# Patient Record
Sex: Male | Born: 1970 | Race: White | Hispanic: No | Marital: Married | Smoking: Current every day smoker
Health system: Southern US, Community
[De-identification: ages and names within clinical notes are randomized; demographics above are authoritative.]

## PROBLEM LIST (undated history)

## (undated) DIAGNOSIS — E785 Hyperlipidemia, unspecified: Secondary | ICD-10-CM

## (undated) DIAGNOSIS — I10 Essential (primary) hypertension: Secondary | ICD-10-CM

## (undated) DIAGNOSIS — G4730 Sleep apnea, unspecified: Secondary | ICD-10-CM

## (undated) HISTORY — DX: Hyperlipidemia, unspecified: E78.5

## (undated) HISTORY — DX: Sleep apnea, unspecified: G47.30

## (undated) HISTORY — PX: LAPAROSCOPIC GASTRIC BANDING: SHX1100

## (undated) HISTORY — DX: Essential (primary) hypertension: I10

---

## 2010-12-07 ENCOUNTER — Encounter: Payer: Self-pay | Admitting: Nurse Practitioner

## 2010-12-07 DIAGNOSIS — E785 Hyperlipidemia, unspecified: Secondary | ICD-10-CM

## 2010-12-07 DIAGNOSIS — I10 Essential (primary) hypertension: Secondary | ICD-10-CM

## 2010-12-08 ENCOUNTER — Encounter: Payer: Self-pay | Admitting: Nurse Practitioner

## 2013-04-07 ENCOUNTER — Encounter: Payer: Self-pay | Admitting: Nurse Practitioner

## 2013-04-07 ENCOUNTER — Ambulatory Visit (INDEPENDENT_AMBULATORY_CARE_PROVIDER_SITE_OTHER): Payer: BC Managed Care – PPO | Admitting: Nurse Practitioner

## 2013-04-07 VITALS — BP 129/88 | HR 70 | Temp 99.0°F | Ht 75.0 in | Wt 288.0 lb

## 2013-04-07 DIAGNOSIS — F411 Generalized anxiety disorder: Secondary | ICD-10-CM

## 2013-04-07 DIAGNOSIS — I10 Essential (primary) hypertension: Secondary | ICD-10-CM

## 2013-04-07 DIAGNOSIS — E785 Hyperlipidemia, unspecified: Secondary | ICD-10-CM

## 2013-04-07 MED ORDER — HYDROCHLOROTHIAZIDE 25 MG PO TABS: 25.0000 mg | ORAL_TABLET | Freq: Every day | ORAL | Status: DC

## 2013-04-07 MED ORDER — CITALOPRAM HYDROBROMIDE 20 MG PO TABS: 20.0000 mg | ORAL_TABLET | Freq: Every day | ORAL | Status: DC

## 2013-04-07 MED ORDER — ATORVASTATIN CALCIUM 40 MG PO TABS: 40.0000 mg | ORAL_TABLET | Freq: Every day | ORAL | Status: DC

## 2013-04-07 NOTE — Progress Notes (Signed)
  Subjective:    Patient ID: Peter Malling., male    DOB: [birth year], 42 y.o.   MRN: [id number]  Hypertension This is a chronic problem. The current episode started more than 1 year ago. The problem is unchanged. The problem is controlled. Pertinent negatives include no blurred vision, chest pain, headaches, neck pain, orthopnea, palpitations, peripheral edema or shortness of breath. Risk factors for coronary artery disease include dyslipidemia, male gender and family history. Past treatments include diuretics. The current treatment provides mild improvement.  Hyperlipidemia This is a chronic problem. The current episode started more than 1 year ago. The problem is controlled. Exacerbating diseases include obesity. He has no history of diabetes or hypothyroidism. Pertinent negatives include no chest pain or shortness of breath. Current antihyperlipidemic treatment includes statins. The current treatment provides mild improvement of lipids. There are no compliance problems.  Risk factors for coronary artery disease include hypertension and male sex.      Review of Systems  HENT: Negative for neck pain.   Eyes: Negative for blurred vision.  Respiratory: Negative for shortness of breath.   Cardiovascular: Negative for chest pain, palpitations and orthopnea.  Neurological: Negative for headaches.  All other systems reviewed and are negative.       Objective:   Physical Exam  Constitutional: He is oriented to person, place, and time. He appears well-developed and well-nourished.  HENT:  Head: Normocephalic.  Right Ear: External ear normal.  Left Ear: External ear normal.  Nose: Nose normal.  Mouth/Throat: Oropharynx is clear and moist.  Eyes: EOM are normal. Pupils are equal, round, and reactive to light.  Neck: Normal range of motion. Neck supple. No thyromegaly present.  Cardiovascular: Normal rate, regular rhythm, normal heart sounds and intact distal pulses.   No murmur  heard. Pulmonary/Chest: Effort normal and breath sounds normal. He has no wheezes. He has no rales.  Abdominal: Soft. Bowel sounds are normal.  Genitourinary: Prostate normal and penis normal.  Musculoskeletal: Normal range of motion.  Neurological: He is alert and oriented to person, place, and time.  Skin: Skin is warm and dry.  Psychiatric: He has a normal mood and affect. His behavior is normal. Judgment and thought content normal.    BP 129/88  Pulse 70  Temp(Src) 99 F (37.2 C) (Oral)  Ht 6\' 3"  (1.905 m)  Wt 288 lb (130.636 kg)  BMI 36 kg/m2       Assessment & Plan:  1. GAD (generalized anxiety disorder) Stress management - citalopram (CELEXA) 20 MG tablet; Take 1 tablet (20 mg total) by mouth daily.  Dispense: 30 tablet; Refill: 3  2. Hypertension Low Na+ diet - CMP14+EGFR - hydrochlorothiazide (HYDRODIURIL) 25 MG tablet; Take 1 tablet (25 mg total) by mouth daily.  Dispense: 30 tablet; Refill: 5  3. Hyperlipidemia Low fat diet and exercsie - NMR, lipoprofile - atorvastatin (LIPITOR) 40 MG tablet; Take 1 tablet (40 mg total) by mouth daily.  Dispense: 30 tablet; Refill: 5  Mary-Margaret Daphine Deutscher, FNP

## 2013-04-07 NOTE — Patient Instructions (Addendum)

## 2013-04-09 LAB — NMR, LIPOPROFILE
HDL Cholesterol by NMR: 47 mg/dL (ref >=40)
LDLC SERPL CALC-MCNC: 176 mg/dL — ABNORMAL HIGH (ref <100)
LP-IR Score: 47 — ABNORMAL HIGH (ref <=45)
Small LDL Particle Number: 1467 nmol/L — ABNORMAL HIGH (ref <=527)

## 2013-04-09 LAB — CMP14+EGFR
ALT: 18 IU/L (ref 0–44)
AST: 18 IU/L (ref 0–40)
Alkaline Phosphatase: 70 IU/L (ref 39–117)
CO2: 25 mmol/L (ref 18–29)
Calcium: 9.6 mg/dL (ref 8.7–10.2)
Potassium: 4.9 mmol/L (ref 3.5–5.2)
Sodium: 138 mmol/L (ref 134–144)

## 2013-04-14 ENCOUNTER — Telehealth: Payer: Self-pay | Admitting: Nurse Practitioner

## 2013-04-15 NOTE — Telephone Encounter (Signed)
Pt aware.

## 2013-06-26 ENCOUNTER — Other Ambulatory Visit: Payer: Self-pay

## 2013-07-02 ENCOUNTER — Encounter: Payer: Self-pay | Admitting: Nurse Practitioner

## 2013-07-03 ENCOUNTER — Other Ambulatory Visit: Payer: Self-pay | Admitting: Nurse Practitioner

## 2013-07-03 DIAGNOSIS — I10 Essential (primary) hypertension: Secondary | ICD-10-CM

## 2013-07-03 DIAGNOSIS — F411 Generalized anxiety disorder: Secondary | ICD-10-CM

## 2013-07-03 DIAGNOSIS — E785 Hyperlipidemia, unspecified: Secondary | ICD-10-CM

## 2013-07-03 MED ORDER — ATORVASTATIN CALCIUM 40 MG PO TABS: 40.0000 mg | ORAL_TABLET | Freq: Every day | ORAL | Status: DC

## 2013-07-03 MED ORDER — CITALOPRAM HYDROBROMIDE 20 MG PO TABS: 20.0000 mg | ORAL_TABLET | Freq: Every day | ORAL | Status: DC

## 2013-07-03 MED ORDER — HYDROCHLOROTHIAZIDE 25 MG PO TABS: 25.0000 mg | ORAL_TABLET | Freq: Every day | ORAL | Status: DC

## 2014-02-11 ENCOUNTER — Other Ambulatory Visit: Payer: Self-pay | Admitting: Nurse Practitioner

## 2014-02-12 ENCOUNTER — Encounter: Payer: Self-pay | Admitting: Nurse Practitioner

## 2014-02-12 NOTE — Telephone Encounter (Signed)
Last seen 04/09/13.

## 2014-05-06 ENCOUNTER — Telehealth: Payer: Self-pay | Admitting: Nurse Practitioner

## 2014-05-06 DIAGNOSIS — I10 Essential (primary) hypertension: Secondary | ICD-10-CM

## 2014-05-06 DIAGNOSIS — F411 Generalized anxiety disorder: Secondary | ICD-10-CM

## 2014-05-06 DIAGNOSIS — E785 Hyperlipidemia, unspecified: Secondary | ICD-10-CM

## 2014-05-07 MED ORDER — CITALOPRAM HYDROBROMIDE 20 MG PO TABS: 20.0000 mg | ORAL_TABLET | Freq: Every day | ORAL | Status: DC

## 2014-05-07 MED ORDER — HYDROCHLOROTHIAZIDE 25 MG PO TABS: 25.0000 mg | ORAL_TABLET | Freq: Every day | ORAL | Status: DC

## 2014-05-07 MED ORDER — ATORVASTATIN CALCIUM 40 MG PO TABS: 40.0000 mg | ORAL_TABLET | Freq: Every day | ORAL | Status: DC

## 2014-05-07 NOTE — Telephone Encounter (Signed)
I sent the Rx to the pharmacy.

## 2014-06-26 ENCOUNTER — Ambulatory Visit (INDEPENDENT_AMBULATORY_CARE_PROVIDER_SITE_OTHER): Payer: BC Managed Care – PPO | Admitting: Nurse Practitioner

## 2014-06-26 ENCOUNTER — Encounter: Payer: Self-pay | Admitting: Nurse Practitioner

## 2014-06-26 VITALS — BP 125/82 | HR 76 | Temp 98.6°F | Ht 75.0 in | Wt 297.4 lb

## 2014-06-26 DIAGNOSIS — E785 Hyperlipidemia, unspecified: Secondary | ICD-10-CM

## 2014-06-26 DIAGNOSIS — F32A Depression, unspecified: Secondary | ICD-10-CM

## 2014-06-26 DIAGNOSIS — I10 Essential (primary) hypertension: Secondary | ICD-10-CM

## 2014-06-26 DIAGNOSIS — F411 Generalized anxiety disorder: Secondary | ICD-10-CM

## 2014-06-26 DIAGNOSIS — F329 Major depressive disorder, single episode, unspecified: Secondary | ICD-10-CM

## 2014-06-26 DIAGNOSIS — Z6837 Body mass index (BMI) 37.0-37.9, adult: Secondary | ICD-10-CM

## 2014-06-26 MED ORDER — HYDROCHLOROTHIAZIDE 25 MG PO TABS: 25.0000 mg | ORAL_TABLET | Freq: Every day | ORAL | Status: DC

## 2014-06-26 MED ORDER — ATORVASTATIN CALCIUM 40 MG PO TABS: 40.0000 mg | ORAL_TABLET | Freq: Every day | ORAL | Status: DC

## 2014-06-26 MED ORDER — CITALOPRAM HYDROBROMIDE 20 MG PO TABS: 20.0000 mg | ORAL_TABLET | Freq: Every day | ORAL | Status: DC

## 2014-06-26 NOTE — Patient Instructions (Signed)

## 2014-06-26 NOTE — Progress Notes (Signed)
Subjective:    Patient ID: Peter Murray., male    DOB: Sep 21, 1970, 43 y.o.   MRN: [id number]   Patient here today for follow up of chronic medical problems.   Hyperlipidemia This is a chronic problem. The current episode started more than 1 year ago. The problem is controlled. Exacerbating diseases include obesity. He has no history of diabetes or hypothyroidism. Pertinent negatives include no chest pain or shortness of breath. Current antihyperlipidemic treatment includes statins. The current treatment provides mild improvement of lipids. There are no compliance problems.  Risk factors for coronary artery disease include hypertension and male sex.  Hypertension This is a chronic problem. The current episode started more than 1 year ago. The problem is unchanged. The problem is controlled. Pertinent negatives include no blurred vision, chest pain, headaches, neck pain, orthopnea, palpitations, peripheral edema or shortness of breath. Risk factors for coronary artery disease include dyslipidemia, male gender and family history. Past treatments include diuretics. The current treatment provides mild improvement.  depression Celexa working well- he has no side effects   Review of Systems  Eyes: Negative for blurred vision.  Respiratory: Negative for shortness of breath.   Cardiovascular: Negative for chest pain, palpitations and orthopnea.  Musculoskeletal: Negative for neck pain.  Neurological: Negative for headaches.  All other systems reviewed and are negative.      Objective:   Physical Exam  Constitutional: He is oriented to person, place, and time. He appears well-developed and well-nourished.  HENT:  Head: Normocephalic.  Right Ear: External ear normal.  Left Ear: External ear normal.  Nose: Nose normal.  Mouth/Throat: Oropharynx is clear and moist.  Eyes: EOM are normal. Pupils are equal, round, and reactive to light.  Neck: Normal range of motion. Neck supple. No JVD  present. No thyromegaly present.  Cardiovascular: Normal rate, regular rhythm, normal heart sounds and intact distal pulses.  Exam reveals no gallop and no friction rub.   No murmur heard. Pulmonary/Chest: Effort normal and breath sounds normal. No respiratory distress. He has no wheezes. He has no rales. He exhibits no tenderness.  Abdominal: Soft. Bowel sounds are normal. He exhibits no mass. There is no tenderness.  Genitourinary: Prostate normal and penis normal.  Musculoskeletal: Normal range of motion. He exhibits no edema.  Lymphadenopathy:    He has no cervical adenopathy.  Neurological: He is alert and oriented to person, place, and time. No cranial nerve deficit.  Skin: Skin is warm and dry.  Psychiatric: He has a normal mood and affect. His behavior is normal. Judgment and thought content normal.    BP 125/82 mmHg  Pulse 76  Temp(Src) 98.6 F (37 C) (Oral)  Ht _0  (1.905 m)  Wt 297 lb 6.4 oz (134.9 kg)  BMI 37.17 kg/m2       Assessment & Plan:  1. BMI 37.0-37.9, adult Discussed diet and exercise for person with BMI >25 Will recheck weight in 3-6 months  2. Essential hypertension Low na diet - CMP14+EGFR - hydrochlorothiazide (HYDRODIURIL) 25 MG tablet; Take 1 tablet (25 mg total) by mouth daily.  Dispense: 30 tablet; Refill: 5  3. Hyperlipidemia Low fat diet - NMR, lipoprofile - atorvastatin (LIPITOR) 40 MG tablet; Take 1 tablet (40 mg total) by mouth daily.  Dispense: 30 tablet; Refill: 5  4. Depression Stress amanegment  5. GAD (generalized anxiety disorder) Stress managemnet - citalopram (CELEXA) 20 MG tablet; Take 1 tablet (20 mg total) by mouth daily.  Dispense: 30 tablet; Refill: 5  Labs pending Health maintenance reviewed Diet and exercise encouraged Continue all meds Follow up  In 6 month   [place], FNP

## 2014-06-27 LAB — CMP14+EGFR
A/G RATIO: 1.8 (ref 1.1–2.5)
ALT: 28 IU/L (ref 0–44)
AST: 23 IU/L (ref 0–40)
Albumin: 4.5 g/dL (ref 3.5–5.5)
Alkaline Phosphatase: 68 IU/L (ref 39–117)
BUN / CREAT RATIO: 11 (ref 9–20)
BUN: 12 mg/dL (ref 6–24)
CO2: 30 mmol/L — ABNORMAL HIGH (ref 18–29)
Calcium: 9.5 mg/dL (ref 8.7–10.2)
Chloride: 100 mmol/L (ref 97–108)
Creatinine, Ser: 1.09 mg/dL (ref 0.76–1.27)
GFR calc non Af Amer: 83 mL/min/{1.73_m2} (ref >59)
GFR, EST AFRICAN AMERICAN: 96 mL/min/{1.73_m2} (ref >59)
GLUCOSE: 105 mg/dL — AB (ref 65–99)
Globulin, Total: 2.5 g/dL (ref 1.5–4.5)
POTASSIUM: 4.3 mmol/L (ref 3.5–5.2)
Sodium: 141 mmol/L (ref 134–144)
TOTAL PROTEIN: 7 g/dL (ref 6.0–8.5)
Total Bilirubin: 0.6 mg/dL (ref 0.0–1.2)

## 2014-06-27 LAB — NMR, LIPOPROFILE
CHOLESTEROL: 149 mg/dL (ref 100–199)
HDL Cholesterol by NMR: 50 mg/dL (ref >39)
HDL Particle Number: 33.5 umol/L (ref >=30.5)
LDL Particle Number: 1175 nmol/L — ABNORMAL HIGH (ref <1000)
LDL SIZE: 20.7 nm (ref >20.5)
LDL-C: 84 mg/dL (ref 0–99)
LP-IR Score: 45 (ref <=45)
Small LDL Particle Number: 465 nmol/L (ref <=527)
TRIGLYCERIDES BY NMR: 76 mg/dL (ref 0–149)

## 2014-12-28 ENCOUNTER — Encounter: Payer: Self-pay | Admitting: Nurse Practitioner

## 2014-12-28 ENCOUNTER — Ambulatory Visit (INDEPENDENT_AMBULATORY_CARE_PROVIDER_SITE_OTHER): Payer: BC Managed Care – PPO | Admitting: Nurse Practitioner

## 2014-12-28 VITALS — BP 124/87 | HR 66 | Temp 97.6°F | Ht 75.0 in | Wt 297.0 lb

## 2014-12-28 DIAGNOSIS — I10 Essential (primary) hypertension: Secondary | ICD-10-CM

## 2014-12-28 DIAGNOSIS — E785 Hyperlipidemia, unspecified: Secondary | ICD-10-CM

## 2014-12-28 DIAGNOSIS — F411 Generalized anxiety disorder: Secondary | ICD-10-CM

## 2014-12-28 DIAGNOSIS — Z23 Encounter for immunization: Secondary | ICD-10-CM | POA: Diagnosis not present

## 2014-12-28 MED ORDER — HYDROCHLOROTHIAZIDE 25 MG PO TABS: 25.0000 mg | ORAL_TABLET | Freq: Every day | ORAL | Status: DC

## 2014-12-28 MED ORDER — CITALOPRAM HYDROBROMIDE 20 MG PO TABS
20.0000 mg | ORAL_TABLET | Freq: Every day | ORAL | Status: DC
Start: 2014-12-28 — End: 2015-05-04

## 2014-12-28 MED ORDER — ATORVASTATIN CALCIUM 40 MG PO TABS: 40.0000 mg | ORAL_TABLET | Freq: Every day | ORAL | Status: DC

## 2014-12-28 NOTE — Progress Notes (Signed)
Subjective:    Patient ID: Peter Murray., male    DOB: 1971/08/05, 44 y.o.   MRN: [id number]   Patient here today for follow up of chronic medical problems.   Hyperlipidemia This is a chronic problem. The current episode started more than 1 year ago. The problem is controlled. Exacerbating diseases include obesity. He has no history of diabetes or hypothyroidism. Pertinent negatives include no chest pain or shortness of breath. Current antihyperlipidemic treatment includes statins. The current treatment provides mild improvement of lipids. There are no compliance problems.  Risk factors for coronary artery disease include hypertension and male sex.  Hypertension This is a chronic problem. The current episode started more than 1 year ago. The problem is unchanged. The problem is controlled. Pertinent negatives include no blurred vision, chest pain, headaches, neck pain, orthopnea, palpitations, peripheral edema or shortness of breath. Risk factors for coronary artery disease include dyslipidemia, male gender and family history. Past treatments include diuretics. The current treatment provides mild improvement.  depression Celexa working well- he has no side effects   Review of Systems  Eyes: Negative for blurred vision.  Respiratory: Negative for shortness of breath.   Cardiovascular: Negative for chest pain, palpitations and orthopnea.  Musculoskeletal: Negative for neck pain.  Neurological: Negative for headaches.  All other systems reviewed and are negative.      Objective:   Physical Exam  Constitutional: He is oriented to person, place, and time. He appears well-developed and well-nourished.  HENT:  Head: Normocephalic.  Right Ear: External ear normal.  Left Ear: External ear normal.  Nose: Nose normal.  Mouth/Throat: Oropharynx is clear and moist.  Eyes: EOM are normal. Pupils are equal, round, and reactive to light.  Neck: Normal range of motion. Neck supple. No JVD  present. No thyromegaly present.  Cardiovascular: Normal rate, regular rhythm, normal heart sounds and intact distal pulses.  Exam reveals no gallop and no friction rub.   No murmur heard. Pulmonary/Chest: Effort normal and breath sounds normal. No respiratory distress. He has no wheezes. He has no rales. He exhibits no tenderness.  Abdominal: Soft. Bowel sounds are normal. He exhibits no mass. There is no tenderness.  Genitourinary: Prostate normal and penis normal.  Musculoskeletal: Normal range of motion. He exhibits no edema.  Lymphadenopathy:    He has no cervical adenopathy.  Neurological: He is alert and oriented to person, place, and time. No cranial nerve deficit.  Skin: Skin is warm and dry.  Psychiatric: He has a normal mood and affect. His behavior is normal. Judgment and thought content normal.    BP 124/87 mmHg  Pulse 66  Temp(Src) 97.6 F (36.4 C) (Oral)  Ht _0  (1.905 m)  Wt 297 lb (134.718 kg)  BMI 37.12 kg/m2       Assessment & Plan:  1. BMI 37.0-37.9, adult Discussed diet and exercise for person with BMI >25 Will recheck weight in 3-6 months  2. Essential hypertension Low na diet - CMP14+EGFR - hydrochlorothiazide (HYDRODIURIL) 25 MG tablet; Take 1 tablet (25 mg total) by mouth daily.  Dispense: 30 tablet; Refill: 5  3. Hyperlipidemia Low fat diet - NMR, lipoprofile - atorvastatin (LIPITOR) 40 MG tablet; Take 1 tablet (40 mg total) by mouth daily.  Dispense: 30 tablet; Refill: 5  4. Depression Stress amanegment  5. GAD (generalized anxiety disorder) Stress managemnet - citalopram (CELEXA) 20 MG tablet; Take 1 tablet (20 mg total) by mouth daily.  Dispense: 30 tablet; Refill: 5  Labs pending Health maintenance reviewed Diet and exercise encouraged Continue all meds Follow up  In 6 month   [place], FNP

## 2014-12-28 NOTE — Addendum Note (Signed)
Addended by: Rolena Infante on: 12/28/2014 11:28 AM   Modules accepted: Orders

## 2014-12-28 NOTE — Patient Instructions (Signed)
Exercise to Lose Weight Exercise and a healthy diet may help you lose weight. Your doctor may suggest specific exercises. EXERCISE IDEAS AND TIPS  Choose low-cost things you enjoy doing, such as walking, bicycling, or exercising to workout videos.  Take stairs instead of the elevator.  Walk during your lunch break.  Park your car further away from work or school.  Go to a gym or an exercise class.  Start with 5 to 10 minutes of exercise each day. Build up to 30 minutes of exercise 4 to 6 days a week.  Wear shoes with good support and comfortable clothes.  Stretch before and after working out.  Work out until you breathe harder and your heart beats faster.  Drink extra water when you exercise.  Do not do so much that you hurt yourself, feel dizzy, or get very short of breath. Exercises that burn about 150 calories:  Running 1  miles in 15 minutes.  Playing volleyball for 45 to 60 minutes.  Washing and waxing a car for 45 to 60 minutes.  Playing touch football for 45 minutes.  Walking 1  miles in 35 minutes.  Pushing a stroller 1  miles in 30 minutes.  Playing basketball for 30 minutes.  Raking leaves for 30 minutes.  Bicycling 5 miles in 30 minutes.  Walking 2 miles in 30 minutes.  Dancing for 30 minutes.  Shoveling snow for 15 minutes.  Swimming laps for 20 minutes.  Walking up stairs for 15 minutes.  Bicycling 4 miles in 15 minutes.  Gardening for 30 to 45 minutes.  Jumping rope for 15 minutes.  Washing windows or floors for 45 to 60 minutes. Document Released: 09/09/2010 Document Revised: 10/30/2011 Document Reviewed: 09/09/2010 [employer] Patient Information 2015 [employer]. This information is not intended to replace advice given to you by your health care provider. Make sure you discuss any questions you have with your health care provider.  

## 2014-12-29 LAB — NMR, LIPOPROFILE
CHOLESTEROL: 138 mg/dL (ref 100–199)
HDL Cholesterol by NMR: 42 mg/dL (ref >39)
HDL Particle Number: 30 umol/L — ABNORMAL LOW (ref >=30.5)
LDL Particle Number: 1039 nmol/L — ABNORMAL HIGH (ref <1000)
LDL Size: 20.8 nm (ref >20.5)
LDL-C: 81 mg/dL (ref 0–99)
LP-IR Score: 44 (ref <=45)
SMALL LDL PARTICLE NUMBER: 318 nmol/L (ref <=527)
TRIGLYCERIDES BY NMR: 73 mg/dL (ref 0–149)

## 2014-12-29 LAB — CMP14+EGFR
ALT: 19 IU/L (ref 0–44)
AST: 20 IU/L (ref 0–40)
Albumin/Globulin Ratio: 2 (ref 1.1–2.5)
Albumin: 4.5 g/dL (ref 3.5–5.5)
Alkaline Phosphatase: 58 IU/L (ref 39–117)
BUN/Creatinine Ratio: 10 (ref 9–20)
BUN: 10 mg/dL (ref 6–24)
Bilirubin Total: 0.6 mg/dL (ref 0.0–1.2)
CALCIUM: 9.4 mg/dL (ref 8.7–10.2)
CHLORIDE: 97 mmol/L (ref 97–108)
CO2: 26 mmol/L (ref 18–29)
Creatinine, Ser: 1 mg/dL (ref 0.76–1.27)
GFR calc Af Amer: 106 mL/min/{1.73_m2} (ref >59)
GFR calc non Af Amer: 92 mL/min/{1.73_m2} (ref >59)
GLUCOSE: 98 mg/dL (ref 65–99)
Globulin, Total: 2.3 g/dL (ref 1.5–4.5)
POTASSIUM: 4 mmol/L (ref 3.5–5.2)
Sodium: 139 mmol/L (ref 134–144)
TOTAL PROTEIN: 6.8 g/dL (ref 6.0–8.5)

## 2015-05-04 ENCOUNTER — Other Ambulatory Visit: Payer: Self-pay

## 2015-05-04 DIAGNOSIS — I10 Essential (primary) hypertension: Secondary | ICD-10-CM

## 2015-05-04 DIAGNOSIS — F411 Generalized anxiety disorder: Secondary | ICD-10-CM

## 2015-05-04 MED ORDER — CITALOPRAM HYDROBROMIDE 20 MG PO TABS: 20.0000 mg | ORAL_TABLET | Freq: Every day | ORAL | Status: DC

## 2015-05-04 MED ORDER — HYDROCHLOROTHIAZIDE 25 MG PO TABS: 25.0000 mg | ORAL_TABLET | Freq: Every day | ORAL | Status: DC

## 2015-05-04 NOTE — Telephone Encounter (Signed)
Last seen 01/07/15 MMM requesting 90 day supply

## 2015-05-04 NOTE — Telephone Encounter (Signed)
Last seen 12/28/14 MMM  Requesting 90 day supply

## 2015-06-02 ENCOUNTER — Encounter: Payer: Self-pay | Admitting: *Deleted

## 2015-06-30 ENCOUNTER — Ambulatory Visit (INDEPENDENT_AMBULATORY_CARE_PROVIDER_SITE_OTHER): Payer: BC Managed Care – PPO | Admitting: Nurse Practitioner

## 2015-06-30 ENCOUNTER — Encounter: Payer: Self-pay | Admitting: Nurse Practitioner

## 2015-06-30 ENCOUNTER — Ambulatory Visit (INDEPENDENT_AMBULATORY_CARE_PROVIDER_SITE_OTHER): Payer: BC Managed Care – PPO

## 2015-06-30 VITALS — BP 123/82 | HR 66 | Temp 97.3°F | Ht 75.0 in | Wt 291.0 lb

## 2015-06-30 DIAGNOSIS — I10 Essential (primary) hypertension: Secondary | ICD-10-CM | POA: Diagnosis not present

## 2015-06-30 DIAGNOSIS — F329 Major depressive disorder, single episode, unspecified: Secondary | ICD-10-CM | POA: Diagnosis not present

## 2015-06-30 DIAGNOSIS — E785 Hyperlipidemia, unspecified: Secondary | ICD-10-CM | POA: Diagnosis not present

## 2015-06-30 DIAGNOSIS — Z6836 Body mass index (BMI) 36.0-36.9, adult: Secondary | ICD-10-CM | POA: Diagnosis not present

## 2015-06-30 DIAGNOSIS — F32A Depression, unspecified: Secondary | ICD-10-CM

## 2015-06-30 MED ORDER — ATORVASTATIN CALCIUM 40 MG PO TABS: 40.0000 mg | ORAL_TABLET | Freq: Every day | ORAL | Status: DC

## 2015-06-30 NOTE — Progress Notes (Signed)
  Subjective:    Patient ID: Peter Katz., male    DOB: [birth year], 44 y.o.   MRN: [id number]   Patient here today for follow up of chronic medical problems.   Hyperlipidemia This is a chronic problem. The current episode started more than 1 year ago. The problem is controlled. Exacerbating diseases include obesity. He has no history of diabetes or hypothyroidism. Pertinent negatives include no chest pain or shortness of breath. Current antihyperlipidemic treatment includes statins. The current treatment provides mild improvement of lipids. There are no compliance problems.  Risk factors for coronary artery disease include hypertension and male sex.  Hypertension This is a chronic problem. The current episode started more than 1 year ago. The problem is unchanged. The problem is controlled. Pertinent negatives include no blurred vision, chest pain, headaches, neck pain, orthopnea, palpitations, peripheral edema or shortness of breath. Risk factors for coronary artery disease include dyslipidemia, male gender and family history. Past treatments include diuretics. The current treatment provides mild improvement.  depression Celexa working well- he has no side effects   Review of Systems  Eyes: Negative for blurred vision.  Respiratory: Negative for shortness of breath.   Cardiovascular: Negative for chest pain, palpitations and orthopnea.  Musculoskeletal: Negative for neck pain.  Neurological: Negative for headaches.  All other systems reviewed and are negative.      Objective:   Physical Exam  Constitutional: He is oriented to person, place, and time. He appears well-developed and well-nourished.  HENT:  Head: Normocephalic.  Right Ear: External ear normal.  Left Ear: External ear normal.  Nose: Nose normal.  Mouth/Throat: Oropharynx is clear and moist.  Eyes: EOM are normal. Pupils are equal, round, and reactive to light.  Neck: Normal range of motion. Neck supple. No JVD  present. No thyromegaly present.  Cardiovascular: Normal rate, regular rhythm, normal heart sounds and intact distal pulses.  Exam reveals no gallop and no friction rub.   No murmur heard. Pulmonary/Chest: Effort normal and breath sounds normal. No respiratory distress. He has no wheezes. He has no rales. He exhibits no tenderness.  Abdominal: Soft. Bowel sounds are normal. He exhibits no mass. There is no tenderness.  Genitourinary: Prostate normal and penis normal.  Musculoskeletal: Normal range of motion. He exhibits no edema.  Lymphadenopathy:    He has no cervical adenopathy.  Neurological: He is alert and oriented to person, place, and time. No cranial nerve deficit.  Skin: Skin is warm and dry.  Psychiatric: He has a normal mood and affect. His behavior is normal. Judgment and thought content normal.    BP 123/82 mmHg  Pulse 66  Temp(Src) 97.3 F (36.3 C) (Oral)  Ht $R'6\' 3"'Jp$  (1.905 m)  Wt 291 lb (131.997 kg)  BMI 36.37 kg/m2  Adella Nissen, FNP   Chest X-ray- mild chronic bronchitic changes-Preliminary reading by Ronnald Collum, FNP  [facility]     Assessment & Plan:  1. Essential hypertension No added salt to diet and continue with exercise - DG Chest 2 View; Future - EKG 12-Lead  2. Hyperlipidemia Low fat diet - Lipid panel - CMP14+EGFR - atorvastatin (LIPITOR) 40 MG tablet; Take 1 tablet (40 mg total) by mouth daily.  Dispense: 30 tablet; Refill: 5  3. Depression Stress management, continue with current medications  4. BMI 36.0-36.9,adult Continued diet and exercise   Continue all meds Labs pending Health Maintenance reviewed Diet and exercise encouraged RTO 3 months  Mary-Margaret Hassell Done, FNP

## 2015-06-30 NOTE — Patient Instructions (Signed)

## 2015-07-01 ENCOUNTER — Ambulatory Visit: Payer: BC Managed Care – PPO | Admitting: Nurse Practitioner

## 2015-07-01 LAB — CMP14+EGFR
ALBUMIN: 4.4 g/dL (ref 3.5–5.5)
ALT: 23 IU/L (ref 0–44)
AST: 22 IU/L (ref 0–40)
Albumin/Globulin Ratio: 1.7 (ref 1.1–2.5)
Alkaline Phosphatase: 57 IU/L (ref 39–117)
BUN/Creatinine Ratio: 11 (ref 9–20)
BUN: 12 mg/dL (ref 6–24)
Bilirubin Total: 0.4 mg/dL (ref 0.0–1.2)
CALCIUM: 9.8 mg/dL (ref 8.7–10.2)
CO2: 29 mmol/L (ref 18–29)
CREATININE: 1.13 mg/dL (ref 0.76–1.27)
Chloride: 96 mmol/L — ABNORMAL LOW (ref 97–106)
GFR calc Af Amer: 91 mL/min/{1.73_m2} (ref >59)
GFR, EST NON AFRICAN AMERICAN: 79 mL/min/{1.73_m2} (ref >59)
GLUCOSE: 108 mg/dL — AB (ref 65–99)
Globulin, Total: 2.6 g/dL (ref 1.5–4.5)
Potassium: 4.6 mmol/L (ref 3.5–5.2)
SODIUM: 139 mmol/L (ref 136–144)
Total Protein: 7 g/dL (ref 6.0–8.5)

## 2015-07-01 LAB — LIPID PANEL
Chol/HDL Ratio: 3 ratio units (ref 0.0–5.0)
Cholesterol, Total: 142 mg/dL (ref 100–199)
HDL: 48 mg/dL (ref >39)
LDL Calculated: 80 mg/dL (ref 0–99)
Triglycerides: 70 mg/dL (ref 0–149)
VLDL Cholesterol Cal: 14 mg/dL (ref 5–40)

## 2015-09-12 ENCOUNTER — Other Ambulatory Visit: Payer: Self-pay | Admitting: Nurse Practitioner

## 2015-10-04 ENCOUNTER — Ambulatory Visit: Payer: BC Managed Care – PPO | Admitting: Nurse Practitioner

## 2015-10-14 ENCOUNTER — Ambulatory Visit (INDEPENDENT_AMBULATORY_CARE_PROVIDER_SITE_OTHER): Payer: BC Managed Care – PPO | Admitting: Nurse Practitioner

## 2015-10-14 ENCOUNTER — Encounter: Payer: Self-pay | Admitting: Nurse Practitioner

## 2015-10-14 VITALS — BP 113/79 | HR 75 | Temp 97.4°F | Ht 75.0 in | Wt 298.0 lb

## 2015-10-14 DIAGNOSIS — I10 Essential (primary) hypertension: Secondary | ICD-10-CM | POA: Diagnosis not present

## 2015-10-14 DIAGNOSIS — F32A Depression, unspecified: Secondary | ICD-10-CM

## 2015-10-14 DIAGNOSIS — F329 Major depressive disorder, single episode, unspecified: Secondary | ICD-10-CM

## 2015-10-14 DIAGNOSIS — Z6836 Body mass index (BMI) 36.0-36.9, adult: Secondary | ICD-10-CM

## 2015-10-14 DIAGNOSIS — E785 Hyperlipidemia, unspecified: Secondary | ICD-10-CM | POA: Diagnosis not present

## 2015-10-14 DIAGNOSIS — Z6838 Body mass index (BMI) 38.0-38.9, adult: Secondary | ICD-10-CM | POA: Insufficient documentation

## 2015-10-14 MED ORDER — ATORVASTATIN CALCIUM 40 MG PO TABS: 40.0000 mg | ORAL_TABLET | Freq: Every day | ORAL | Status: DC

## 2015-10-14 MED ORDER — CITALOPRAM HYDROBROMIDE 20 MG PO TABS: 20.0000 mg | ORAL_TABLET | Freq: Every day | ORAL | Status: DC

## 2015-10-14 MED ORDER — HYDROCHLOROTHIAZIDE 25 MG PO TABS
25.0000 mg | ORAL_TABLET | Freq: Every day | ORAL | Status: DC
Start: 2015-10-14 — End: 2016-04-14

## 2015-10-14 NOTE — Progress Notes (Signed)
Subjective:    Patient ID: Peter Katz., male    DOB: 14-Oct-1970, 45 y.o.   MRN: [id number]   Patient here today for follow up of chronic medical problems.  Outpatient Encounter Prescriptions as of 10/14/2015  Medication Sig  . atorvastatin (LIPITOR) 40 MG tablet Take 1 tablet (40 mg total) by mouth daily.  . citalopram (CELEXA) 20 MG tablet TAKE 1 TABLET BY MOUTH DAILY  . hydrochlorothiazide (HYDRODIURIL) 25 MG tablet TAKE 1 TABLET BY MOUTH DAILY   No facility-administered encounter medications on file as of 10/14/2015.     Hyperlipidemia This is a chronic problem. The current episode started more than 1 year ago. The problem is controlled. Exacerbating diseases include obesity. He has no history of diabetes or hypothyroidism. Pertinent negatives include no chest pain or shortness of breath. Current antihyperlipidemic treatment includes statins. The current treatment provides mild improvement of lipids. There are no compliance problems.  Risk factors for coronary artery disease include hypertension and male sex.  Hypertension This is a chronic problem. The current episode started more than 1 year ago. The problem is unchanged. The problem is controlled. Pertinent negatives include no blurred vision, chest pain, headaches, neck pain, orthopnea, palpitations, peripheral edema or shortness of breath. Risk factors for coronary artery disease include dyslipidemia, male gender and family history. Past treatments include diuretics. The current treatment provides mild improvement.  depression Celexa working well- he has no side effects   Review of Systems  Eyes: Negative for blurred vision.  Respiratory: Negative for shortness of breath.   Cardiovascular: Negative for chest pain, palpitations and orthopnea.  Musculoskeletal: Negative for neck pain.  Neurological: Negative for headaches.  All other systems reviewed and are negative.      Objective:   Physical Exam  Constitutional: He  is oriented to person, place, and time. He appears well-developed and well-nourished.  HENT:  Head: Normocephalic.  Right Ear: External ear normal.  Left Ear: External ear normal.  Nose: Nose normal.  Mouth/Throat: Oropharynx is clear and moist.  Eyes: EOM are normal. Pupils are equal, round, and reactive to light.  Neck: Normal range of motion. Neck supple. No JVD present. No thyromegaly present.  Cardiovascular: Normal rate, regular rhythm, normal heart sounds and intact distal pulses.  Exam reveals no gallop and no friction rub.   No murmur heard. Pulmonary/Chest: Effort normal and breath sounds normal. No respiratory distress. He has no wheezes. He has no rales. He exhibits no tenderness.  Abdominal: Soft. Bowel sounds are normal. He exhibits no mass. There is no tenderness.  Genitourinary: Prostate normal and penis normal.  Musculoskeletal: Normal range of motion. He exhibits no edema.  Lymphadenopathy:    He has no cervical adenopathy.  Neurological: He is alert and oriented to person, place, and time. No cranial nerve deficit.  Skin: Skin is warm and dry.  Psychiatric: He has a normal mood and affect. His behavior is normal. Judgment and thought content normal.   BP 113/79 mmHg  Pulse 75  Temp(Src) 97.4 F (36.3 C) (Oral)  Ht _0  (1.905 m)  Wt 298 lb (135.172 kg)  BMI 37.25 kg/m2     Assessment & Plan:   1. Essential hypertension Do not add salt to diet - hydrochlorothiazide (HYDRODIURIL) 25 MG tablet; Take 1 tablet (25 mg total) by mouth daily.  Dispense: 90 tablet; Refill: 1 - CMP14+EGFR  2. Hyperlipidemia Low fat diet - atorvastatin (LIPITOR) 40 MG tablet; Take 1 tablet (40 mg total) by mouth  daily.  Dispense: 90 tablet; Refill: 1 - Lipid panel  3. Depression Stress management - citalopram (CELEXA) 20 MG tablet; Take 1 tablet (20 mg total) by mouth daily.  Dispense: 90 tablet; Refill: 1  4. BMI 36.0-36.9,adult Discussed diet and exercise for person with BMI  >25 Will recheck weight in 3-6 months     Labs pending Health maintenance reviewed Diet and exercise encouraged Continue all meds Follow up  In 6 month   [place], FNP

## 2015-10-14 NOTE — Patient Instructions (Signed)

## 2015-10-15 LAB — CMP14+EGFR
A/G RATIO: 1.8 (ref 1.1–2.5)
ALBUMIN: 4.3 g/dL (ref 3.5–5.5)
ALK PHOS: 56 IU/L (ref 39–117)
ALT: 21 IU/L (ref 0–44)
AST: 19 IU/L (ref 0–40)
BILIRUBIN TOTAL: 0.7 mg/dL (ref 0.0–1.2)
BUN / CREAT RATIO: 8 — AB (ref 9–20)
BUN: 10 mg/dL (ref 6–24)
CHLORIDE: 98 mmol/L (ref 96–106)
CO2: 27 mmol/L (ref 18–29)
Calcium: 9.7 mg/dL (ref 8.7–10.2)
Creatinine, Ser: 1.18 mg/dL (ref 0.76–1.27)
GFR calc Af Amer: 86 mL/min/{1.73_m2} (ref >59)
GFR calc non Af Amer: 75 mL/min/{1.73_m2} (ref >59)
GLOBULIN, TOTAL: 2.4 g/dL (ref 1.5–4.5)
GLUCOSE: 101 mg/dL — AB (ref 65–99)
POTASSIUM: 4.4 mmol/L (ref 3.5–5.2)
SODIUM: 141 mmol/L (ref 134–144)
Total Protein: 6.7 g/dL (ref 6.0–8.5)

## 2015-10-15 LAB — LIPID PANEL
CHOLESTEROL TOTAL: 141 mg/dL (ref 100–199)
Chol/HDL Ratio: 3.3 ratio units (ref 0.0–5.0)
HDL: 43 mg/dL (ref >39)
LDL Calculated: 78 mg/dL (ref 0–99)
Triglycerides: 98 mg/dL (ref 0–149)
VLDL Cholesterol Cal: 20 mg/dL (ref 5–40)

## 2016-04-05 ENCOUNTER — Ambulatory Visit: Payer: BC Managed Care – PPO | Admitting: Nurse Practitioner

## 2016-04-14 ENCOUNTER — Encounter: Payer: Self-pay | Admitting: Nurse Practitioner

## 2016-04-14 ENCOUNTER — Ambulatory Visit (INDEPENDENT_AMBULATORY_CARE_PROVIDER_SITE_OTHER): Payer: BC Managed Care – PPO | Admitting: Nurse Practitioner

## 2016-04-14 DIAGNOSIS — F329 Major depressive disorder, single episode, unspecified: Secondary | ICD-10-CM

## 2016-04-14 DIAGNOSIS — I10 Essential (primary) hypertension: Secondary | ICD-10-CM | POA: Diagnosis not present

## 2016-04-14 DIAGNOSIS — E785 Hyperlipidemia, unspecified: Secondary | ICD-10-CM | POA: Diagnosis not present

## 2016-04-14 DIAGNOSIS — F32A Depression, unspecified: Secondary | ICD-10-CM

## 2016-04-14 MED ORDER — HYDROCHLOROTHIAZIDE 25 MG PO TABS
25.0000 mg | ORAL_TABLET | Freq: Every day | ORAL | 1 refills | Status: DC
Start: 2016-04-14 — End: 2016-10-13

## 2016-04-14 MED ORDER — ATORVASTATIN CALCIUM 40 MG PO TABS: 40.0000 mg | ORAL_TABLET | Freq: Every day | ORAL | 1 refills | Status: DC

## 2016-04-14 MED ORDER — CITALOPRAM HYDROBROMIDE 20 MG PO TABS: 20.0000 mg | ORAL_TABLET | Freq: Every day | ORAL | 1 refills | Status: DC

## 2016-04-14 NOTE — Progress Notes (Signed)
Subjective:    Patient ID: Peter Murray., male    DOB: 10-May-1971, 45 y.o.   MRN: [id number]   Patient here today for follow up of chronic medical problems.NO changes since last visit. No complaints today.  Outpatient Encounter Prescriptions as of 04/14/2016  Medication Sig  . atorvastatin (LIPITOR) 40 MG tablet Take 1 tablet (40 mg total) by mouth daily.  . citalopram (CELEXA) 20 MG tablet Take 1 tablet (20 mg total) by mouth daily.  . hydrochlorothiazide (HYDRODIURIL) 25 MG tablet Take 1 tablet (25 mg total) by mouth daily.   No facility-administered encounter medications on file as of 04/14/2016.      Hyperlipidemia  This is a chronic problem. The current episode started more than 1 year ago. The problem is controlled. Exacerbating diseases include obesity. He has no history of diabetes or hypothyroidism. Pertinent negatives include no chest pain or shortness of breath. Current antihyperlipidemic treatment includes statins. The current treatment provides mild improvement of lipids. There are no compliance problems.  Risk factors for coronary artery disease include hypertension and male sex.  Hypertension  This is a chronic problem. The current episode started more than 1 year ago. The problem is unchanged. The problem is controlled. Pertinent negatives include no blurred vision, chest pain, headaches, neck pain, orthopnea, palpitations, peripheral edema or shortness of breath. Risk factors for coronary artery disease include dyslipidemia, male gender and family history. Past treatments include diuretics. The current treatment provides mild improvement.  depression Celexa working well- he has no side effects Depression screen [facility] 2/9 04/14/2016 10/14/2015 06/30/2015 12/28/2014 06/26/2014  Decreased Interest 0 1 0 0 0  Down, Depressed, Hopeless 0 1 0 0 0  PHQ - 2 Score 0 2 0 0 0      Review of Systems  Eyes: Negative for blurred vision.  Respiratory: Negative for shortness of breath.    Cardiovascular: Negative for chest pain, palpitations and orthopnea.  Musculoskeletal: Negative for neck pain.  Neurological: Negative for headaches.  All other systems reviewed and are negative.      Objective:   Physical Exam  Constitutional: He is oriented to person, place, and time. He appears well-developed and well-nourished.  HENT:  Head: Normocephalic.  Right Ear: External ear normal.  Left Ear: External ear normal.  Nose: Nose normal.  Mouth/Throat: Oropharynx is clear and moist.  Eyes: EOM are normal. Pupils are equal, round, and reactive to light.  Neck: Normal range of motion. Neck supple. No JVD present. No thyromegaly present.  Cardiovascular: Normal rate, regular rhythm, normal heart sounds and intact distal pulses.  Exam reveals no gallop and no friction rub.   No murmur heard. Pulmonary/Chest: Effort normal and breath sounds normal. No respiratory distress. He has no wheezes. He has no rales. He exhibits no tenderness.  Abdominal: Soft. Bowel sounds are normal. He exhibits no mass. There is no tenderness.  Genitourinary: Prostate normal and penis normal.  Musculoskeletal: Normal range of motion. He exhibits no edema.  Lymphadenopathy:    He has no cervical adenopathy.  Neurological: He is alert and oriented to person, place, and time. No cranial nerve deficit.  Skin: Skin is warm and dry.  Psychiatric: He has a normal mood and affect. His behavior is normal. Judgment and thought content normal.   BP 123/89   Pulse 60   Temp 97.6 F (36.4 C) (Oral)   Ht _0  (1.905 m)   Wt (!) 307 lb (139.3 kg)   BMI 38.37 kg/m  Assessment & Plan:   1. Depression Stress managment - citalopram (CELEXA) 20 MG tablet; Take 1 tablet (20 mg total) by mouth daily.  Dispense: 90 tablet; Refill: 1  2. Essential hypertension Do not add salt to diet - hydrochlorothiazide (HYDRODIURIL) 25 MG tablet; Take 1 tablet (25 mg total) by mouth daily.  Dispense: 90 tablet; Refill:  1 - CMP14+EGFR  3. Hyperlipidemia Low fat diet - atorvastatin (LIPITOR) 40 MG tablet; Take 1 tablet (40 mg total) by mouth daily.  Dispense: 90 tablet; Refill: 1 - Lipid panel    Labs pending Health maintenance reviewed Diet and exercise encouraged Continue all meds Follow up  In 6 months   [place], FNP

## 2016-04-15 LAB — CMP14+EGFR
A/G RATIO: 1.7 (ref 1.2–2.2)
ALBUMIN: 4.6 g/dL (ref 3.5–5.5)
ALT: 21 IU/L (ref 0–44)
AST: 21 IU/L (ref 0–40)
Alkaline Phosphatase: 59 IU/L (ref 39–117)
BUN / CREAT RATIO: 8 — AB (ref 9–20)
BUN: 9 mg/dL (ref 6–24)
Bilirubin Total: 0.6 mg/dL (ref 0.0–1.2)
CALCIUM: 9.7 mg/dL (ref 8.7–10.2)
CHLORIDE: 98 mmol/L (ref 96–106)
CO2: 29 mmol/L (ref 18–29)
Creatinine, Ser: 1.11 mg/dL (ref 0.76–1.27)
GFR, EST AFRICAN AMERICAN: 93 mL/min/{1.73_m2} (ref >59)
GFR, EST NON AFRICAN AMERICAN: 80 mL/min/{1.73_m2} (ref >59)
GLOBULIN, TOTAL: 2.7 g/dL (ref 1.5–4.5)
Glucose: 89 mg/dL (ref 65–99)
Potassium: 4.3 mmol/L (ref 3.5–5.2)
Sodium: 141 mmol/L (ref 134–144)
TOTAL PROTEIN: 7.3 g/dL (ref 6.0–8.5)

## 2016-04-15 LAB — LIPID PANEL
CHOL/HDL RATIO: 3.2 ratio (ref 0.0–5.0)
CHOLESTEROL TOTAL: 131 mg/dL (ref 100–199)
HDL: 41 mg/dL (ref >39)
LDL CALC: 77 mg/dL (ref 0–99)
TRIGLYCERIDES: 64 mg/dL (ref 0–149)
VLDL Cholesterol Cal: 13 mg/dL (ref 5–40)

## 2016-10-13 ENCOUNTER — Ambulatory Visit (INDEPENDENT_AMBULATORY_CARE_PROVIDER_SITE_OTHER): Payer: BC Managed Care – PPO | Admitting: Nurse Practitioner

## 2016-10-13 ENCOUNTER — Encounter: Payer: Self-pay | Admitting: Nurse Practitioner

## 2016-10-13 VITALS — BP 122/78 | HR 63 | Temp 98.4°F | Ht 75.0 in | Wt 305.6 lb

## 2016-10-13 DIAGNOSIS — E7800 Pure hypercholesterolemia, unspecified: Secondary | ICD-10-CM | POA: Diagnosis not present

## 2016-10-13 DIAGNOSIS — F3342 Major depressive disorder, recurrent, in full remission: Secondary | ICD-10-CM

## 2016-10-13 DIAGNOSIS — I10 Essential (primary) hypertension: Secondary | ICD-10-CM

## 2016-10-13 DIAGNOSIS — Z6838 Body mass index (BMI) 38.0-38.9, adult: Secondary | ICD-10-CM | POA: Diagnosis not present

## 2016-10-13 MED ORDER — HYDROCHLOROTHIAZIDE 25 MG PO TABS: 25.0000 mg | ORAL_TABLET | Freq: Every day | ORAL | 1 refills | Status: DC

## 2016-10-13 MED ORDER — ATORVASTATIN CALCIUM 40 MG PO TABS
40.0000 mg | ORAL_TABLET | Freq: Every day | ORAL | 1 refills | Status: DC
Start: 2016-10-13 — End: 2017-04-13

## 2016-10-13 MED ORDER — CITALOPRAM HYDROBROMIDE 20 MG PO TABS
20.0000 mg | ORAL_TABLET | Freq: Every day | ORAL | 1 refills | Status: DC
Start: 2016-10-13 — End: 2017-04-13

## 2016-10-13 NOTE — Patient Instructions (Signed)
Exercising to Lose Weight Introduction Exercising can help you to lose weight. In order to lose weight through exercise, you need to do vigorous-intensity exercise. You can tell that you are exercising with vigorous intensity if you are breathing very hard and fast and cannot hold a conversation while exercising. Moderate-intensity exercise helps to maintain your current weight. You can tell that you are exercising at a moderate level if you have a higher heart rate and faster breathing, but you are still able to hold a conversation. How often should I exercise? Choose an activity that you enjoy and set realistic goals. Your health care provider can help you to make an activity plan that works for you. Exercise regularly as directed by your health care provider. This may include:  Doing resistance training twice each week, such as:  Push-ups.  Sit-ups.  Lifting weights.  Using resistance bands.  Doing a given intensity of exercise for a given amount of time. Choose from these options:  150 minutes of moderate-intensity exercise every week.  75 minutes of vigorous-intensity exercise every week.  A mix of moderate-intensity and vigorous-intensity exercise every week. Children, pregnant women, people who are out of shape, people who are overweight, and older adults may need to consult a health care provider for individual recommendations. If you have any sort of medical condition, be sure to consult your health care provider before starting a new exercise program. What are some activities that can help me to lose weight?  Walking at a rate of at least 4.5 miles an hour.  Jogging or running at a rate of 5 miles per hour.  Biking at a rate of at least 10 miles per hour.  Lap swimming.  Roller-skating or in-line skating.  Cross-country skiing.  Vigorous competitive sports, such as football, basketball, and soccer.  Jumping rope.  Aerobic dancing. How can I be more active in my  day-to-day activities?  Use the stairs instead of the elevator.  Take a walk during your lunch break.  If you drive, park your car farther away from work or school.  If you take public transportation, get off one stop early and walk the rest of the way.  Make all of your phone calls while standing up and walking around.  Get up, stretch, and walk around every 30 minutes throughout the day. What guidelines should I follow while exercising?  Do not exercise so much that you hurt yourself, feel dizzy, or get very short of breath.  Consult your health care provider prior to starting a new exercise program.  Wear comfortable clothes and shoes with good support.  Drink plenty of water while you exercise to prevent dehydration or heat stroke. Body water is lost during exercise and must be replaced.  Work out until you breathe faster and your heart beats faster. This information is not intended to replace advice given to you by your health care provider. Make sure you discuss any questions you have with your health care provider. Document Released: 09/09/2010 Document Revised: 01/13/2016 Document Reviewed: 01/08/2014  2017 Elsevier  

## 2016-10-13 NOTE — Progress Notes (Signed)
Subjective:    Patient ID: Peter Katz., male    DOB: Jan 01, 1971, 46 y.o.   MRN: [id number]   Patient here today for follow up of chronic medical problems.  Outpatient Encounter Prescriptions as of 10/13/2016  Medication Sig  . atorvastatin (LIPITOR) 40 MG tablet Take 1 tablet (40 mg total) by mouth daily.  . citalopram (CELEXA) 20 MG tablet Take 1 tablet (20 mg total) by mouth daily.  . hydrochlorothiazide (HYDRODIURIL) 25 MG tablet Take 1 tablet (25 mg total) by mouth daily.   No facility-administered encounter medications on file as of 10/13/2016.      Hyperlipidemia  This is a chronic problem. The current episode started more than 1 year ago. The problem is controlled. Exacerbating diseases include obesity. He has no history of diabetes or hypothyroidism. Pertinent negatives include no chest pain or shortness of breath. Current antihyperlipidemic treatment includes statins. The current treatment provides mild improvement of lipids. There are no compliance problems.  Risk factors for coronary artery disease include hypertension and male sex.  Hypertension  This is a chronic problem. The current episode started more than 1 year ago. The problem is unchanged. The problem is controlled. Pertinent negatives include no blurred vision, chest pain, headaches, neck pain, orthopnea, palpitations, peripheral edema or shortness of breath. Risk factors for coronary artery disease include dyslipidemia, male gender and family history. Past treatments include diuretics. The current treatment provides mild improvement.  depression Celexa working well- he has no side effects   Review of Systems  Eyes: Negative for blurred vision.  Respiratory: Negative for shortness of breath.   Cardiovascular: Negative for chest pain, palpitations and orthopnea.  Musculoskeletal: Negative for neck pain.  Neurological: Negative for headaches.  All other systems reviewed and are negative.      Objective:   Physical Exam  Constitutional: He is oriented to person, place, and time. He appears well-developed and well-nourished.  HENT:  Head: Normocephalic.  Right Ear: External ear normal.  Left Ear: External ear normal.  Nose: Nose normal.  Mouth/Throat: Oropharynx is clear and moist.  Eyes: EOM are normal. Pupils are equal, round, and reactive to light.  Neck: Normal range of motion. Neck supple. No JVD present. No thyromegaly present.  Cardiovascular: Normal rate, regular rhythm, normal heart sounds and intact distal pulses.  Exam reveals no gallop and no friction rub.   No murmur heard. Pulmonary/Chest: Effort normal and breath sounds normal. No respiratory distress. He has no wheezes. He has no rales. He exhibits no tenderness.  Abdominal: Soft. Bowel sounds are normal. He exhibits no mass. There is no tenderness.  Genitourinary: Prostate normal and penis normal.  Musculoskeletal: Normal range of motion. He exhibits no edema.  Lymphadenopathy:    He has no cervical adenopathy.  Neurological: He is alert and oriented to person, place, and time. No cranial nerve deficit.  Skin: Skin is warm and dry.  Psychiatric: He has a normal mood and affect. His behavior is normal. Judgment and thought content normal.   BP 122/78   Pulse 63   Temp 98.4 F (36.9 C) (Oral)   Ht '6\' 3"'  (1.905 m)   Wt (!) 305 lb 9.6 oz (138.6 kg)   BMI 38.20 kg/m      Assessment & Plan:   1. Essential hypertension Low sodium diet - hydrochlorothiazide (HYDRODIURIL) 25 MG tablet; Take 1 tablet (25 mg total) by mouth daily.  Dispense: 90 tablet; Refill: 1 - CMP14+EGFR  2. BMI 38.0-38.9,adult Weight has increased  Discussed diet and exercise for person with BMI >25 Will recheck weight in 3-6 months  3. Recurrent major depressive disorder, in full remission ([place]) Stress management reviewed - citalopram (CELEXA) 20 MG tablet; Take 1 tablet (20 mg total) by mouth daily.  Dispense: 90 tablet; Refill: 1  4. Pure  hypercholesterolemia Low fat diet - atorvastatin (LIPITOR) 40 MG tablet; Take 1 tablet (40 mg total) by mouth daily.  Dispense: 90 tablet; Refill: 1 - Lipid panel    Labs pending Health maintenance reviewed Diet and exercise encouraged Continue all meds Follow up  In 6 months   [place], FNP

## 2016-10-14 LAB — CMP14+EGFR
A/G RATIO: 1.8 (ref 1.2–2.2)
ALT: 21 IU/L (ref 0–44)
AST: 16 IU/L (ref 0–40)
Albumin: 4.6 g/dL (ref 3.5–5.5)
Alkaline Phosphatase: 64 IU/L (ref 39–117)
BILIRUBIN TOTAL: 0.6 mg/dL (ref 0.0–1.2)
BUN/Creatinine Ratio: 9 (ref 9–20)
BUN: 10 mg/dL (ref 6–24)
CALCIUM: 9.8 mg/dL (ref 8.7–10.2)
CO2: 28 mmol/L (ref 18–29)
Chloride: 100 mmol/L (ref 96–106)
Creatinine, Ser: 1.1 mg/dL (ref 0.76–1.27)
GFR, EST AFRICAN AMERICAN: 93 mL/min/{1.73_m2} (ref >59)
GFR, EST NON AFRICAN AMERICAN: 81 mL/min/{1.73_m2} (ref >59)
Globulin, Total: 2.6 g/dL (ref 1.5–4.5)
Glucose: 104 mg/dL — ABNORMAL HIGH (ref 65–99)
POTASSIUM: 4.3 mmol/L (ref 3.5–5.2)
SODIUM: 144 mmol/L (ref 134–144)
TOTAL PROTEIN: 7.2 g/dL (ref 6.0–8.5)

## 2016-10-14 LAB — LIPID PANEL
CHOL/HDL RATIO: 3.2 ratio (ref 0.0–5.0)
Cholesterol, Total: 120 mg/dL (ref 100–199)
HDL: 38 mg/dL — ABNORMAL LOW (ref >39)
LDL Calculated: 72 mg/dL (ref 0–99)
Triglycerides: 51 mg/dL (ref 0–149)
VLDL Cholesterol Cal: 10 mg/dL (ref 5–40)

## 2016-11-01 ENCOUNTER — Other Ambulatory Visit: Payer: Self-pay | Admitting: Nurse Practitioner

## 2016-11-01 DIAGNOSIS — E785 Hyperlipidemia, unspecified: Secondary | ICD-10-CM

## 2017-01-06 ENCOUNTER — Other Ambulatory Visit: Payer: Self-pay | Admitting: Nurse Practitioner

## 2017-01-06 DIAGNOSIS — I10 Essential (primary) hypertension: Secondary | ICD-10-CM

## 2017-01-06 DIAGNOSIS — F329 Major depressive disorder, single episode, unspecified: Secondary | ICD-10-CM

## 2017-01-06 DIAGNOSIS — F32A Depression, unspecified: Secondary | ICD-10-CM

## 2017-04-13 ENCOUNTER — Ambulatory Visit (INDEPENDENT_AMBULATORY_CARE_PROVIDER_SITE_OTHER): Payer: BC Managed Care – PPO | Admitting: Nurse Practitioner

## 2017-04-13 ENCOUNTER — Encounter: Payer: Self-pay | Admitting: Nurse Practitioner

## 2017-04-13 VITALS — BP 110/77 | HR 69 | Temp 98.7°F | Ht 75.0 in | Wt 301.0 lb

## 2017-04-13 DIAGNOSIS — R739 Hyperglycemia, unspecified: Secondary | ICD-10-CM | POA: Diagnosis not present

## 2017-04-13 DIAGNOSIS — I10 Essential (primary) hypertension: Secondary | ICD-10-CM | POA: Diagnosis not present

## 2017-04-13 DIAGNOSIS — E7800 Pure hypercholesterolemia, unspecified: Secondary | ICD-10-CM | POA: Diagnosis not present

## 2017-04-13 DIAGNOSIS — Z6837 Body mass index (BMI) 37.0-37.9, adult: Secondary | ICD-10-CM | POA: Diagnosis not present

## 2017-04-13 DIAGNOSIS — F3342 Major depressive disorder, recurrent, in full remission: Secondary | ICD-10-CM

## 2017-04-13 MED ORDER — ATORVASTATIN CALCIUM 40 MG PO TABS
40.0000 mg | ORAL_TABLET | Freq: Every day | ORAL | 1 refills | Status: DC
Start: 2017-04-13 — End: 2017-10-15

## 2017-04-13 MED ORDER — CITALOPRAM HYDROBROMIDE 20 MG PO TABS: 20.0000 mg | ORAL_TABLET | Freq: Every day | ORAL | 1 refills | Status: DC

## 2017-04-13 MED ORDER — HYDROCHLOROTHIAZIDE 25 MG PO TABS: 25.0000 mg | ORAL_TABLET | Freq: Every day | ORAL | 1 refills | Status: DC

## 2017-04-13 NOTE — Patient Instructions (Signed)
Exercising to Lose Weight Exercising can help you to lose weight. In order to lose weight through exercise, you need to do vigorous-intensity exercise. You can tell that you are exercising with vigorous intensity if you are breathing very hard and fast and cannot hold a conversation while exercising. Moderate-intensity exercise helps to maintain your current weight. You can tell that you are exercising at a moderate level if you have a higher heart rate and faster breathing, but you are still able to hold a conversation. How often should I exercise? Choose an activity that you enjoy and set realistic goals. Your health care provider can help you to make an activity plan that works for you. Exercise regularly as directed by your health care provider. This may include:  Doing resistance training twice each week, such as: ? Push-ups. ? Sit-ups. ? Lifting weights. ? Using resistance bands.  Doing a given intensity of exercise for a given amount of time. Choose from these options: ? 150 minutes of moderate-intensity exercise every week. ? 75 minutes of vigorous-intensity exercise every week. ? A mix of moderate-intensity and vigorous-intensity exercise every week.  Children, pregnant women, people who are out of shape, people who are overweight, and older adults may need to consult a health care provider for individual recommendations. If you have any sort of medical condition, be sure to consult your health care provider before starting a new exercise program. What are some activities that can help me to lose weight?  Walking at a rate of at least 4.5 miles an hour.  Jogging or running at a rate of 5 miles per hour.  Biking at a rate of at least 10 miles per hour.  Lap swimming.  Roller-skating or in-line skating.  Cross-country skiing.  Vigorous competitive sports, such as football, basketball, and soccer.  Jumping rope.  Aerobic dancing. How can I be more active in my day-to-day  activities?  Use the stairs instead of the elevator.  Take a walk during your lunch break.  If you drive, park your car farther away from work or school.  If you take public transportation, get off one stop early and walk the rest of the way.  Make all of your phone calls while standing up and walking around.  Get up, stretch, and walk around every 30 minutes throughout the day. What guidelines should I follow while exercising?  Do not exercise so much that you hurt yourself, feel dizzy, or get very short of breath.  Consult your health care provider prior to starting a new exercise program.  Wear comfortable clothes and shoes with good support.  Drink plenty of water while you exercise to prevent dehydration or heat stroke. Body water is lost during exercise and must be replaced.  Work out until you breathe faster and your heart beats faster. This information is not intended to replace advice given to you by your health care provider. Make sure you discuss any questions you have with your health care provider. Document Released: 09/09/2010 Document Revised: 01/13/2016 Document Reviewed: 01/08/2014 Elsevier Interactive Patient Education  2018 Elsevier Inc.  

## 2017-04-13 NOTE — Progress Notes (Signed)
Subjective:    Patient ID: Peter Katz., male    DOB: 09-Oct-1970, 46 y.o.   MRN: [id number]  HPI  Peter Minney. is here today for follow up of chronic medical problem.  Outpatient Encounter Prescriptions as of 04/13/2017  Medication Sig  . atorvastatin (LIPITOR) 40 MG tablet Take 1 tablet (40 mg total) by mouth daily.  . citalopram (CELEXA) 20 MG tablet TAKE 1 TABLET (20 MG TOTAL) BY MOUTH DAILY.  . hydrochlorothiazide (HYDRODIURIL) 25 MG tablet Take 1 tablet (25 mg total) by mouth daily.    1. Essential hypertension  No chest pain, SOB or headache. He occasionally checks his blood pressure at home and it usually runs 502'D systolic  2. Pure hypercholesterolemia  Has not been watching diet at home  3. Recurrent major depressive disorder, in full remission [facility])  Currently on celexa without sie effects Depression screen [facility] 2/9 04/13/2017 10/13/2016 04/14/2016 10/14/2015 06/30/2015  Decreased Interest 0 0 0 1 0  Down, Depressed, Hopeless 0 0 0 1 0  PHQ - 2 Score 0 0 0 2 0     4. BMI 38.0-38.9,adult  No recent weight changes    New complaints: none  Social history: wife's name is [name] as well. Has 2 step children- no kids of his own.     Review of Systems  Constitutional: Negative for activity change and appetite change.  HENT: Negative.   Eyes: Negative for pain.  Respiratory: Negative for shortness of breath.   Cardiovascular: Negative for chest pain, palpitations and leg swelling.  Gastrointestinal: Negative for abdominal pain.  Endocrine: Negative for polydipsia.  Genitourinary: Negative.   Skin: Negative for rash.  Neurological: Negative for dizziness, weakness and headaches.  Hematological: Does not bruise/bleed easily.  Psychiatric/Behavioral: Negative.   All other systems reviewed and are negative.      Objective:   Physical Exam  Constitutional: He is oriented to person, place, and time. He appears well-developed and well-nourished.    HENT:  Head: Normocephalic.  Right Ear: External ear normal.  Left Ear: External ear normal.  Nose: Nose normal.  Mouth/Throat: Oropharynx is clear and moist.  Eyes: Pupils are equal, round, and reactive to light. EOM are normal.  Neck: Normal range of motion. Neck supple. No JVD present. No thyromegaly present.  Cardiovascular: Normal rate, regular rhythm, normal heart sounds and intact distal pulses.  Exam reveals no gallop and no friction rub.   No murmur heard. Pulmonary/Chest: Effort normal and breath sounds normal. No respiratory distress. He has no wheezes. He has no rales. He exhibits no tenderness.  Abdominal: Soft. Bowel sounds are normal. He exhibits no mass. There is no tenderness.  Genitourinary: Prostate normal and penis normal.  Musculoskeletal: Normal range of motion. He exhibits no edema.  Lymphadenopathy:    He has no cervical adenopathy.  Neurological: He is alert and oriented to person, place, and time. No cranial nerve deficit.  Skin: Skin is warm and dry.  Psychiatric: He has a normal mood and affect. His behavior is normal. Judgment and thought content normal.   BP 110/77   Pulse 69   Temp 98.7 F (37.1 C) (Oral)   Ht '6\' 3"'  (1.905 m)   Wt (!) 301 lb (136.5 kg)   BMI 37.62 kg/m         Assessment & Plan:  1. Essential hypertension Low sodium diet - hydrochlorothiazide (HYDRODIURIL) 25 MG tablet; Take 1 tablet (25 mg total) by mouth daily.  Dispense: 90 tablet; Refill: 1 - CMP14+EGFR  2. Pure hypercholesterolemia Low fat diet - atorvastatin (LIPITOR) 40 MG tablet; Take 1 tablet (40 mg total) by mouth daily.  Dispense: 90 tablet; Refill: 1 - Lipid panel  3. Recurrent major depressive disorder, in full remission ([place]) Stress management - citalopram (CELEXA) 20 MG tablet; Take 1 tablet (20 mg total) by mouth daily.  Dispense: 90 tablet; Refill: 1  4. BMI 37.0-37.9, adult Discussed diet and exercise for person with BMI >25 Will recheck weight in  3-6 months     Labs pending Health maintenance reviewed Diet and exercise encouraged Continue all meds Follow up  In 6 months   [place], FNP

## 2017-04-14 LAB — CMP14+EGFR
A/G RATIO: 1.7 (ref 1.2–2.2)
ALT: 18 IU/L (ref 0–44)
AST: 16 IU/L (ref 0–40)
Albumin: 4.3 g/dL (ref 3.5–5.5)
Alkaline Phosphatase: 60 IU/L (ref 39–117)
BUN/Creatinine Ratio: 7 — ABNORMAL LOW (ref 9–20)
BUN: 8 mg/dL (ref 6–24)
Bilirubin Total: 0.3 mg/dL (ref 0.0–1.2)
CALCIUM: 9.3 mg/dL (ref 8.7–10.2)
CO2: 28 mmol/L (ref 20–29)
CREATININE: 1.21 mg/dL (ref 0.76–1.27)
Chloride: 98 mmol/L (ref 96–106)
GFR, EST AFRICAN AMERICAN: 83 mL/min/{1.73_m2} (ref >59)
GFR, EST NON AFRICAN AMERICAN: 72 mL/min/{1.73_m2} (ref >59)
Globulin, Total: 2.5 g/dL (ref 1.5–4.5)
Glucose: 112 mg/dL — ABNORMAL HIGH (ref 65–99)
POTASSIUM: 4 mmol/L (ref 3.5–5.2)
Sodium: 139 mmol/L (ref 134–144)
TOTAL PROTEIN: 6.8 g/dL (ref 6.0–8.5)

## 2017-04-14 LAB — LIPID PANEL
CHOL/HDL RATIO: 3.1 ratio (ref 0.0–5.0)
Cholesterol, Total: 110 mg/dL (ref 100–199)
HDL: 36 mg/dL — AB (ref >39)
LDL CALC: 63 mg/dL (ref 0–99)
TRIGLYCERIDES: 53 mg/dL (ref 0–149)
VLDL CHOLESTEROL CAL: 11 mg/dL (ref 5–40)

## 2017-04-15 ENCOUNTER — Other Ambulatory Visit: Payer: Self-pay | Admitting: Nurse Practitioner

## 2017-04-15 DIAGNOSIS — I10 Essential (primary) hypertension: Secondary | ICD-10-CM

## 2017-04-16 ENCOUNTER — Other Ambulatory Visit: Payer: Self-pay | Admitting: Nurse Practitioner

## 2017-04-16 DIAGNOSIS — F329 Major depressive disorder, single episode, unspecified: Secondary | ICD-10-CM

## 2017-04-16 DIAGNOSIS — F32A Depression, unspecified: Secondary | ICD-10-CM

## 2017-04-16 LAB — BAYER DCA HB A1C WAIVED: HB A1C: 5.1 % (ref <7.0)

## 2017-04-16 NOTE — Addendum Note (Signed)
Addended by: Chevis Pretty on: 04/16/2017 12:01 PM   Modules accepted: Orders

## 2017-10-15 ENCOUNTER — Ambulatory Visit: Payer: BC Managed Care – PPO | Admitting: Nurse Practitioner

## 2017-10-15 ENCOUNTER — Encounter: Payer: Self-pay | Admitting: Nurse Practitioner

## 2017-10-15 VITALS — BP 114/78 | HR 66 | Temp 98.0°F | Ht 75.0 in | Wt 304.0 lb

## 2017-10-15 DIAGNOSIS — Z6838 Body mass index (BMI) 38.0-38.9, adult: Secondary | ICD-10-CM | POA: Diagnosis not present

## 2017-10-15 DIAGNOSIS — E7800 Pure hypercholesterolemia, unspecified: Secondary | ICD-10-CM

## 2017-10-15 DIAGNOSIS — I10 Essential (primary) hypertension: Secondary | ICD-10-CM | POA: Diagnosis not present

## 2017-10-15 DIAGNOSIS — F3342 Major depressive disorder, recurrent, in full remission: Secondary | ICD-10-CM

## 2017-10-15 MED ORDER — ATORVASTATIN CALCIUM 40 MG PO TABS: 40.0000 mg | ORAL_TABLET | Freq: Every day | ORAL | 1 refills | Status: DC

## 2017-10-15 MED ORDER — HYDROCHLOROTHIAZIDE 25 MG PO TABS: 25.0000 mg | ORAL_TABLET | Freq: Every day | ORAL | 1 refills | Status: DC

## 2017-10-15 MED ORDER — CITALOPRAM HYDROBROMIDE 20 MG PO TABS: 20.0000 mg | ORAL_TABLET | Freq: Every day | ORAL | 1 refills | Status: DC

## 2017-10-15 NOTE — Progress Notes (Signed)
Subjective:    Patient ID: Peter Katz., male    DOB: April 24, 1971, 47 y.o.   MRN: [id number]  HPI  Peter Cooksey. is here today for follow up of chronic medical problem.  Outpatient Encounter Medications as of 10/15/2017  Medication Sig  . atorvastatin (LIPITOR) 40 MG tablet Take 1 tablet (40 mg total) by mouth daily.  . citalopram (CELEXA) 20 MG tablet Take 1 tablet (20 mg total) by mouth daily.  . citalopram (CELEXA) 20 MG tablet TAKE 1 TABLET BY MOUTH EVERY DAY  . hydrochlorothiazide (HYDRODIURIL) 25 MG tablet Take 1 tablet (25 mg total) by mouth daily.  . hydrochlorothiazide (HYDRODIURIL) 25 MG tablet TAKE 1 TABLET BY MOUTH EVERY DAY     1. Essential hypertension  No c/o chest pain, sob or headache. Does not check blood pressures at home. BP Readings from Last 3 Encounters:  04/13/17 110/77  10/13/16 122/78  04/14/16 123/89     2. BMI 38.0-38.9,adult  No recent weight changes  3. Recurrent major depressive disorder, in full remission [facility])  He has been on celexa for sometime and is doing quite well  4. Pure hypercholesterolemia  Does not watch diet nor does he exercise very much.    New complaints: None today  Social history: His wifes name is [name] and she has 2 kids form previous marriage that he is help raising. 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    Review of Systems  Constitutional: Negative for activity change and appetite change.  HENT: Negative.   Eyes: Negative for pain.  Respiratory: Negative for shortness of breath.   Cardiovascular: Negative for chest pain, palpitations and leg swelling.  Gastrointestinal: Negative for abdominal pain.  Endocrine: Negative for polydipsia.  Genitourinary: Negative.   Skin: Negative for rash.  Neurological: Negative for dizziness, weakness and headaches.  Hematological: Does not bruise/bleed easily.  Psychiatric/Behavioral: Negative.   All other systems reviewed  and are negative.      Objective:   Physical Exam  Constitutional: He is oriented to person, place, and time. He appears well-developed and well-nourished.  HENT:  Head: Normocephalic.  Right Ear: External ear normal.  Left Ear: External ear normal.  Nose: Nose normal.  Mouth/Throat: Oropharynx is clear and moist.  Eyes: EOM are normal. Pupils are equal, round, and reactive to light.  Neck: Normal range of motion. Neck supple. No JVD present. No thyromegaly present.  Cardiovascular: Normal rate, regular rhythm, normal heart sounds and intact distal pulses. Exam reveals no gallop and no friction rub.  No murmur heard. Pulmonary/Chest: Effort normal and breath sounds normal. No respiratory distress. He has no wheezes. He has no rales. He exhibits no tenderness.  Abdominal: Soft. Bowel sounds are normal. He exhibits no mass. There is no tenderness.  Genitourinary: Prostate normal and penis normal.  Musculoskeletal: Normal range of motion. He exhibits no edema.  Lymphadenopathy:    He has no cervical adenopathy.  Neurological: He is alert and oriented to person, place, and time. No cranial nerve deficit.  Skin: Skin is warm and dry.  Psychiatric: He has a normal mood and affect. His behavior is normal. Judgment and thought content normal.    BP 114/78   Pulse 66   Temp 98 F (36.7 C) (Oral)   Ht '6\' 3"'  (1.905 m)   Wt (!) 304 lb (137.9 kg)   BMI 38.00 kg/m        Assessment &  Plan:  1. Essential hypertension Low sodium diet - hydrochlorothiazide (HYDRODIURIL) 25 MG tablet; Take 1 tablet (25 mg total) by mouth daily.  Dispense: 90 tablet; Refill: 1 - CMP14+EGFR  2. BMI 38.0-38.9,adult Discussed diet and exercise for person with BMI >25 Will recheck weight in 3-6 months  3. Recurrent major depressive disorder, in full remission ([place]) Stress management - citalopram (CELEXA) 20 MG tablet; Take 1 tablet (20 mg total) by mouth daily.  Dispense: 90 tablet; Refill: 1  4. Pure  hypercholesterolemia Low fat diet - atorvastatin (LIPITOR) 40 MG tablet; Take 1 tablet (40 mg total) by mouth daily.  Dispense: 90 tablet; Refill: 1 - Lipid panel    Labs pending Health maintenance reviewed Diet and exercise encouraged Continue all meds Follow up  In 6 months   [place], FNP

## 2017-10-15 NOTE — Patient Instructions (Signed)
DASH Eating Plan DASH stands for "Dietary Approaches to Stop Hypertension." The DASH eating plan is a healthy eating plan that has been shown to reduce high blood pressure (hypertension). It may also reduce your risk for type 2 diabetes, heart disease, and stroke. The DASH eating plan may also help with weight loss. What are tips for following this plan? General guidelines  Avoid eating more than 2,300 mg (milligrams) of salt (sodium) a day. If you have hypertension, you may need to reduce your sodium intake to 1,500 mg a day.  Limit alcohol intake to no more than 1 drink a day for nonpregnant women and 2 drinks a day for men. One drink equals 12 oz of beer, 5 oz of wine, or 1 oz of hard liquor.  Work with your health care provider to maintain a healthy body weight or to lose weight. Ask what an ideal weight is for you.  Get at least 30 minutes of exercise that causes your heart to beat faster (aerobic exercise) most days of the week. Activities may include walking, swimming, or biking.  Work with your health care provider or diet and nutrition specialist (dietitian) to adjust your eating plan to your individual calorie needs. Reading food labels  Check food labels for the amount of sodium per serving. Choose foods with less than 5 percent of the Daily Value of sodium. Generally, foods with less than 300 mg of sodium per serving fit into this eating plan.  To find whole grains, look for the word "whole" as the first word in the ingredient list. Shopping  Buy products labeled as "low-sodium" or "no salt added."  Buy fresh foods. Avoid canned foods and premade or frozen meals. Cooking  Avoid adding salt when cooking. Use salt-free seasonings or herbs instead of table salt or sea salt. Check with your health care provider or pharmacist before using salt substitutes.  Do not fry foods. Cook foods using healthy methods such as baking, boiling, grilling, and broiling instead.  Cook with  heart-healthy oils, such as olive, canola, soybean, or sunflower oil. Meal planning   Eat a balanced diet that includes: ? 5 or more servings of fruits and vegetables each day. At each meal, try to fill half of your plate with fruits and vegetables. ? Up to 6-8 servings of whole grains each day. ? Less than 6 oz of lean meat, poultry, or fish each day. A 3-oz serving of meat is about the same size as a deck of cards. One egg equals 1 oz. ? 2 servings of low-fat dairy each day. ? A serving of nuts, seeds, or beans 5 times each week. ? Heart-healthy fats. Healthy fats called Omega-3 fatty acids are found in foods such as flaxseeds and coldwater fish, like sardines, salmon, and mackerel.  Limit how much you eat of the following: ? Canned or prepackaged foods. ? Food that is high in trans fat, such as fried foods. ? Food that is high in saturated fat, such as fatty meat. ? Sweets, desserts, sugary drinks, and other foods with added sugar. ? Full-fat dairy products.  Do not salt foods before eating.  Try to eat at least 2 vegetarian meals each week.  Eat more home-cooked food and less restaurant, buffet, and fast food.  When eating at a restaurant, ask that your food be prepared with less salt or no salt, if possible. What foods are recommended? The items listed may not be a complete list. Talk with your dietitian about what   dietary choices are best for you. Grains Whole-grain or whole-wheat bread. Whole-grain or whole-wheat pasta. Brown rice. Oatmeal. Quinoa. Bulgur. Whole-grain and low-sodium cereals. Pita bread. Low-fat, low-sodium crackers. Whole-wheat flour tortillas. Vegetables Fresh or frozen vegetables (raw, steamed, roasted, or grilled). Low-sodium or reduced-sodium tomato and vegetable juice. Low-sodium or reduced-sodium tomato sauce and tomato paste. Low-sodium or reduced-sodium canned vegetables. Fruits All fresh, dried, or frozen fruit. Canned fruit in natural juice (without  added sugar). Meat and other protein foods Skinless chicken or turkey. Ground chicken or turkey. Pork with fat trimmed off. Fish and seafood. Egg whites. Dried beans, peas, or lentils. Unsalted nuts, nut butters, and seeds. Unsalted canned beans. Lean cuts of beef with fat trimmed off. Low-sodium, lean deli meat. Dairy Low-fat (1%) or fat-free (skim) milk. Fat-free, low-fat, or reduced-fat cheeses. Nonfat, low-sodium ricotta or cottage cheese. Low-fat or nonfat yogurt. Low-fat, low-sodium cheese. Fats and oils Soft margarine without trans fats. Vegetable oil. Low-fat, reduced-fat, or light mayonnaise and salad dressings (reduced-sodium). Canola, safflower, olive, soybean, and sunflower oils. Avocado. Seasoning and other foods Herbs. Spices. Seasoning mixes without salt. Unsalted popcorn and pretzels. Fat-free sweets. What foods are not recommended? The items listed may not be a complete list. Talk with your dietitian about what dietary choices are best for you. Grains Baked goods made with fat, such as croissants, muffins, or some breads. Dry pasta or rice meal packs. Vegetables Creamed or fried vegetables. Vegetables in a cheese sauce. Regular canned vegetables (not low-sodium or reduced-sodium). Regular canned tomato sauce and paste (not low-sodium or reduced-sodium). Regular tomato and vegetable juice (not low-sodium or reduced-sodium). Pickles. Olives. Fruits Canned fruit in a light or heavy syrup. Fried fruit. Fruit in cream or butter sauce. Meat and other protein foods Fatty cuts of meat. Ribs. Fried meat. Bacon. Sausage. Bologna and other processed lunch meats. Salami. Fatback. Hotdogs. Bratwurst. Salted nuts and seeds. Canned beans with added salt. Canned or smoked fish. Whole eggs or egg yolks. Chicken or turkey with skin. Dairy Whole or 2% milk, cream, and half-and-half. Whole or full-fat cream cheese. Whole-fat or sweetened yogurt. Full-fat cheese. Nondairy creamers. Whipped toppings.  Processed cheese and cheese spreads. Fats and oils Butter. Stick margarine. Lard. Shortening. Ghee. Bacon fat. Tropical oils, such as coconut, palm kernel, or palm oil. Seasoning and other foods Salted popcorn and pretzels. Onion salt, garlic salt, seasoned salt, table salt, and sea salt. Worcestershire sauce. Tartar sauce. Barbecue sauce. Teriyaki sauce. Soy sauce, including reduced-sodium. Steak sauce. Canned and packaged gravies. Fish sauce. Oyster sauce. Cocktail sauce. Horseradish that you find on the shelf. Ketchup. Mustard. Meat flavorings and tenderizers. Bouillon cubes. Hot sauce and Tabasco sauce. Premade or packaged marinades. Premade or packaged taco seasonings. Relishes. Regular salad dressings. Where to find more information:  National Heart, Lung, and Blood Institute: www.nhlbi.nih.gov  American Heart Association: www.heart.org Summary  The DASH eating plan is a healthy eating plan that has been shown to reduce high blood pressure (hypertension). It may also reduce your risk for type 2 diabetes, heart disease, and stroke.  With the DASH eating plan, you should limit salt (sodium) intake to 2,300 mg a day. If you have hypertension, you may need to reduce your sodium intake to 1,500 mg a day.  When on the DASH eating plan, aim to eat more fresh fruits and vegetables, whole grains, lean proteins, low-fat dairy, and heart-healthy fats.  Work with your health care provider or diet and nutrition specialist (dietitian) to adjust your eating plan to your individual   calorie needs. This information is not intended to replace advice given to you by your health care provider. Make sure you discuss any questions you have with your health care provider. Document Released: 07/27/2011 Document Revised: 07/31/2016 Document Reviewed: 07/31/2016 Elsevier Interactive Patient Education  2018 Elsevier Inc.  

## 2017-10-16 LAB — LIPID PANEL
CHOL/HDL RATIO: 3.1 ratio (ref 0.0–5.0)
Cholesterol, Total: 126 mg/dL (ref 100–199)
HDL: 41 mg/dL (ref >39)
LDL CALC: 70 mg/dL (ref 0–99)
Triglycerides: 75 mg/dL (ref 0–149)
VLDL Cholesterol Cal: 15 mg/dL (ref 5–40)

## 2017-10-16 LAB — CMP14+EGFR
ALBUMIN: 4.4 g/dL (ref 3.5–5.5)
ALT: 19 IU/L (ref 0–44)
AST: 19 IU/L (ref 0–40)
Albumin/Globulin Ratio: 1.7 (ref 1.2–2.2)
Alkaline Phosphatase: 68 IU/L (ref 39–117)
BUN/Creatinine Ratio: 10 (ref 9–20)
BUN: 13 mg/dL (ref 6–24)
Bilirubin Total: 0.4 mg/dL (ref 0.0–1.2)
CO2: 25 mmol/L (ref 20–29)
CREATININE: 1.25 mg/dL (ref 0.76–1.27)
Calcium: 9.5 mg/dL (ref 8.7–10.2)
Chloride: 99 mmol/L (ref 96–106)
GFR, EST AFRICAN AMERICAN: 79 mL/min/{1.73_m2} (ref >59)
GFR, EST NON AFRICAN AMERICAN: 69 mL/min/{1.73_m2} (ref >59)
GLOBULIN, TOTAL: 2.6 g/dL (ref 1.5–4.5)
Glucose: 103 mg/dL — ABNORMAL HIGH (ref 65–99)
Potassium: 4 mmol/L (ref 3.5–5.2)
SODIUM: 142 mmol/L (ref 134–144)
Total Protein: 7 g/dL (ref 6.0–8.5)

## 2018-04-15 ENCOUNTER — Encounter: Payer: Self-pay | Admitting: Nurse Practitioner

## 2018-04-15 ENCOUNTER — Ambulatory Visit: Payer: BC Managed Care – PPO | Admitting: Nurse Practitioner

## 2018-04-15 VITALS — BP 120/83 | HR 62 | Temp 98.0°F | Ht 75.0 in | Wt 303.2 lb

## 2018-04-15 DIAGNOSIS — Z6838 Body mass index (BMI) 38.0-38.9, adult: Secondary | ICD-10-CM

## 2018-04-15 DIAGNOSIS — I10 Essential (primary) hypertension: Secondary | ICD-10-CM

## 2018-04-15 DIAGNOSIS — F3342 Major depressive disorder, recurrent, in full remission: Secondary | ICD-10-CM | POA: Diagnosis not present

## 2018-04-15 DIAGNOSIS — E7800 Pure hypercholesterolemia, unspecified: Secondary | ICD-10-CM

## 2018-04-15 LAB — CMP14+EGFR
ALBUMIN: 4.5 g/dL (ref 3.5–5.5)
ALT: 19 IU/L (ref 0–44)
AST: 21 IU/L (ref 0–40)
Albumin/Globulin Ratio: 1.8 (ref 1.2–2.2)
Alkaline Phosphatase: 64 IU/L (ref 39–117)
BUN/Creatinine Ratio: 11 (ref 9–20)
BUN: 14 mg/dL (ref 6–24)
Bilirubin Total: 0.7 mg/dL (ref 0.0–1.2)
CALCIUM: 9.8 mg/dL (ref 8.7–10.2)
CO2: 27 mmol/L (ref 20–29)
CREATININE: 1.22 mg/dL (ref 0.76–1.27)
Chloride: 99 mmol/L (ref 96–106)
GFR calc Af Amer: 82 mL/min/{1.73_m2} (ref >59)
GFR, EST NON AFRICAN AMERICAN: 71 mL/min/{1.73_m2} (ref >59)
GLOBULIN, TOTAL: 2.5 g/dL (ref 1.5–4.5)
Glucose: 99 mg/dL (ref 65–99)
Potassium: 3.9 mmol/L (ref 3.5–5.2)
Sodium: 141 mmol/L (ref 134–144)
Total Protein: 7 g/dL (ref 6.0–8.5)

## 2018-04-15 LAB — LIPID PANEL
CHOL/HDL RATIO: 3.3 ratio (ref 0.0–5.0)
Cholesterol, Total: 120 mg/dL (ref 100–199)
HDL: 36 mg/dL — ABNORMAL LOW (ref >39)
LDL CALC: 72 mg/dL (ref 0–99)
Triglycerides: 58 mg/dL (ref 0–149)
VLDL Cholesterol Cal: 12 mg/dL (ref 5–40)

## 2018-04-15 MED ORDER — CITALOPRAM HYDROBROMIDE 20 MG PO TABS: 20.0000 mg | ORAL_TABLET | Freq: Every day | ORAL | 1 refills | Status: DC

## 2018-04-15 MED ORDER — ATORVASTATIN CALCIUM 40 MG PO TABS: 40.0000 mg | ORAL_TABLET | Freq: Every day | ORAL | 1 refills | Status: DC

## 2018-04-15 MED ORDER — HYDROCHLOROTHIAZIDE 25 MG PO TABS: 25.0000 mg | ORAL_TABLET | Freq: Every day | ORAL | 1 refills | Status: DC

## 2018-04-15 NOTE — Progress Notes (Signed)
Subjective:    Patient ID: Peter Katz., male    DOB: Jul 23, 1971, 47 y.o.   MRN: [id number]   Chief Complaint: Medical management of chronic issues  HPI:  1. Essential hypertension  No c/o chest pain, sob or headache. Dos not check blood pressures at home. BP Readings from Last 3 Encounters:  10/15/17 114/78  04/13/17 110/77  10/13/16 122/78     2. Pure hypercholesterolemia  Does not watch diet. Does very little exercise.  3. Recurrent major depressive disorder, in full remission [facility])  Patient is on celexa. He is doing well with no side effects Depression screen [facility] 2/9 04/15/2018 10/15/2017 04/13/2017  Decreased Interest 1 1 0  Down, Depressed, Hopeless 1 1 0  PHQ - 2 Score 2 2 0  Altered sleeping 1 1 -  Tired, decreased energy 1 1 -  Change in appetite 1 2 -  Feeling bad or failure about yourself  0 0 -  Trouble concentrating 0 0 -  Moving slowly or fidgety/restless 0 0 -  Suicidal thoughts 0 0 -  PHQ-9 Score 5 6 -  Difficult doing work/chores Somewhat difficult - -     4. BMI 38.0-38.9,adult  No recent weight changes    Outpatient Encounter Medications as of 04/15/2018  Medication Sig  . atorvastatin (LIPITOR) 40 MG tablet Take 1 tablet (40 mg total) by mouth daily.  . citalopram (CELEXA) 20 MG tablet Take 1 tablet (20 mg total) by mouth daily.  . hydrochlorothiazide (HYDRODIURIL) 25 MG tablet Take 1 tablet (25 mg total) by mouth daily.   No facility-administered encounter medications on file as of 04/15/2018.       New complaints: None today  Social history: Lives with wife and step kids. He likes to [employer].   Review of Systems  Constitutional: Negative for activity change and appetite change.  HENT: Negative.   Eyes: Negative for pain.  Respiratory: Negative for shortness of breath.   Cardiovascular: Negative for chest pain, palpitations and leg swelling.  Gastrointestinal: Negative for abdominal pain.  Endocrine: Negative for  polydipsia.  Genitourinary: Negative.   Skin: Negative for rash.  Neurological: Negative for dizziness, weakness and headaches.  Hematological: Does not bruise/bleed easily.  Psychiatric/Behavioral: Negative.   All other systems reviewed and are negative.      Objective:   Physical Exam  Constitutional: He is oriented to person, place, and time. He appears well-developed and well-nourished. No distress.  HENT:  Head: Normocephalic.  Nose: Nose normal.  Mouth/Throat: Oropharynx is clear and moist.  Eyes: Pupils are equal, round, and reactive to light. EOM are normal.  Neck: Normal range of motion and phonation normal. Neck supple. No JVD present. Carotid bruit is not present. No thyroid mass and no thyromegaly present.  Cardiovascular: Normal rate and regular rhythm.  Pulmonary/Chest: Effort normal and breath sounds normal. No respiratory distress.  Abdominal: Soft. Normal appearance, normal aorta and bowel sounds are normal. There is no tenderness.  Musculoskeletal: Normal range of motion.  Lymphadenopathy:    He has no cervical adenopathy.  Neurological: He is alert and oriented to person, place, and time.  Skin: Skin is warm and dry.  Psychiatric: He has a normal mood and affect. His behavior is normal. Judgment and thought content normal.   BP 120/83   Pulse 62   Temp 98 F (36.7 C) (Oral)   Ht '6\' 3"'  (1.905 m)   Wt (!) 303 lb 3.2 oz (137.5 kg)   BMI  37.90 kg/m       Assessment & Plan:  Peter Katz. comes in today with chief complaint of Medical Management of Chronic Issues (6 month)   Diagnosis and orders addressed:  1. Essential hypertension Low sodium diet - hydrochlorothiazide (HYDRODIURIL) 25 MG tablet; Take 1 tablet (25 mg total) by mouth daily.  Dispense: 90 tablet; Refill: 1 - CMP14+EGFR  2. Pure hypercholesterolemia Low fat diet - atorvastatin (LIPITOR) 40 MG tablet; Take 1 tablet (40 mg total) by mouth daily.  Dispense: 90 tablet; Refill: 1 -  Lipid panel  3. Recurrent major depressive disorder, in full remission ([place]) Stress management - citalopram (CELEXA) 20 MG tablet; Take 1 tablet (20 mg total) by mouth daily.  Dispense: 90 tablet; Refill: 1  4. BMI 38.0-38.9,adult Discussed diet and exercise for person with BMI >25 Will recheck weight in 3-6 months    Labs pending Health Maintenance reviewed Diet and exercise encouraged  Follow up plan: 3 months   Mary-Margaret Hassell Done, FNP

## 2018-06-27 ENCOUNTER — Encounter: Payer: Self-pay | Admitting: *Deleted

## 2018-07-16 ENCOUNTER — Ambulatory Visit: Payer: BC Managed Care – PPO | Admitting: Nurse Practitioner

## 2018-07-24 ENCOUNTER — Encounter: Payer: Self-pay | Admitting: Nurse Practitioner

## 2018-07-24 ENCOUNTER — Ambulatory Visit: Payer: BC Managed Care – PPO | Admitting: Nurse Practitioner

## 2018-07-24 VITALS — BP 116/82 | HR 73 | Temp 97.9°F | Ht 75.0 in | Wt 303.0 lb

## 2018-07-24 DIAGNOSIS — I10 Essential (primary) hypertension: Secondary | ICD-10-CM | POA: Diagnosis not present

## 2018-07-24 DIAGNOSIS — Z6838 Body mass index (BMI) 38.0-38.9, adult: Secondary | ICD-10-CM

## 2018-07-24 DIAGNOSIS — F3342 Major depressive disorder, recurrent, in full remission: Secondary | ICD-10-CM

## 2018-07-24 DIAGNOSIS — Z125 Encounter for screening for malignant neoplasm of prostate: Secondary | ICD-10-CM

## 2018-07-24 DIAGNOSIS — E7800 Pure hypercholesterolemia, unspecified: Secondary | ICD-10-CM | POA: Diagnosis not present

## 2018-07-24 MED ORDER — CITALOPRAM HYDROBROMIDE 20 MG PO TABS: 20.0000 mg | ORAL_TABLET | Freq: Every day | ORAL | 1 refills | Status: DC

## 2018-07-24 MED ORDER — ATORVASTATIN CALCIUM 40 MG PO TABS: 40.0000 mg | ORAL_TABLET | Freq: Every day | ORAL | 1 refills | Status: DC

## 2018-07-24 MED ORDER — HYDROCHLOROTHIAZIDE 25 MG PO TABS: 25.0000 mg | ORAL_TABLET | Freq: Every day | ORAL | 1 refills | Status: DC

## 2018-07-24 NOTE — Patient Instructions (Signed)
Stress and Stress Management Stress is a normal reaction to life events. It is what you feel when life demands more than you are used to or more than you can handle. Some stress can be useful. For example, the stress reaction can help you catch the last bus of the day, study for a test, or meet a deadline at work. But stress that occurs too often or for too long can cause problems. It can affect your emotional health and interfere with relationships and normal daily activities. Too much stress can weaken your immune system and increase your risk for physical illness. If you already have a medical problem, stress can make it worse. What are the causes? All sorts of life events may cause stress. An event that causes stress for one person may not be stressful for another person. Major life events commonly cause stress. These may be positive or negative. Examples include losing your job, moving into a new home, getting married, having a baby, or losing a loved one. Less obvious life events may also cause stress, especially if they occur day after day or in combination. Examples include working long hours, driving in traffic, caring for children, being in debt, or being in a difficult relationship. What are the signs or symptoms? Stress may cause emotional symptoms including, the following:  Anxiety. This is feeling worried, afraid, on edge, overwhelmed, or out of control.  Anger. This is feeling irritated or impatient.  Depression. This is feeling sad, down, helpless, or guilty.  Difficulty focusing, remembering, or making decisions.  Stress may cause physical symptoms, including the following:  Aches and pains. These may affect your head, neck, back, stomach, or other areas of your body.  Tight muscles or clenched jaw.  Low energy or trouble sleeping.  Stress may cause unhealthy behaviors, including the following:  Eating to feel better (overeating) or skipping meals.  Sleeping too little,  too much, or both.  Working too much or putting off tasks (procrastination).  Smoking, drinking alcohol, or using drugs to feel better.  How is this diagnosed? Stress is diagnosed through an assessment by your health care provider. Your health care provider will ask questions about your symptoms and any stressful life events.Your health care provider will also ask about your medical history and may order blood tests or other tests. Certain medical conditions and medicine can cause physical symptoms similar to stress. Mental illness can cause emotional symptoms and unhealthy behaviors similar to stress. Your health care provider may refer you to a mental health professional for further evaluation. How is this treated? Stress management is the recommended treatment for stress.The goals of stress management are reducing stressful life events and coping with stress in healthy ways. Techniques for reducing stressful life events include the following:  Stress identification. Self-monitor for stress and identify what causes stress for you. These skills may help you to avoid some stressful events.  Time management. Set your priorities, keep a calendar of events, and learn to say "no." These tools can help you avoid making too many commitments.  Techniques for coping with stress include the following:  Rethinking the problem. Try to think realistically about stressful events rather than ignoring them or overreacting. Try to find the positives in a stressful situation rather than focusing on the negatives.  Exercise. Physical exercise can release both physical and emotional tension. The key is to find a form of exercise you enjoy and do it regularly.  Relaxation techniques. These relax the body and  mind. Examples include yoga, meditation, tai chi, biofeedback, deep breathing, progressive muscle relaxation, listening to music, being out in nature, journaling, and other hobbies. Again, the key is to find  one or more that you enjoy and can do regularly.  Healthy lifestyle. Eat a balanced diet, get plenty of sleep, and do not smoke. Avoid using alcohol or drugs to relax.  Strong support network. Spend time with family, friends, or other people you enjoy being around.Express your feelings and talk things over with someone you trust.  Counseling or talktherapy with a mental health professional may be helpful if you are having difficulty managing stress on your own. Medicine is typically not recommended for the treatment of stress.Talk to your health care provider if you think you need medicine for symptoms of stress. Follow these instructions at home:  Keep all follow-up visits as directed by your health care provider.  Take all medicines as directed by your health care provider. Contact a health care provider if:  Your symptoms get worse or you start having new symptoms.  You feel overwhelmed by your problems and can no longer manage them on your own. Get help right away if:  You feel like hurting yourself or someone else. This information is not intended to replace advice given to you by your health care provider. Make sure you discuss any questions you have with your health care provider. Document Released: 01/31/2001 Document Revised: 01/13/2016 Document Reviewed: 04/01/2013 Elsevier Interactive Patient Education  2017 Elsevier Inc.  

## 2018-07-24 NOTE — Progress Notes (Signed)
Subjective:    Patient ID: Peter Katz., male    DOB: 06-06-1971, 47 y.o.   MRN: [id number]   Chief Complaint: Medical Management of Chronic Issues   HPI:  1. Essential hypertension  No c/o chest pain, sob or headache. Does not heck blood pressure at home. BP Readings from Last 3 Encounters:  07/24/18 116/82  04/15/18 120/83  10/15/17 114/78     2. BMI 38.0-38.9,adult  No recent weight changes  3. Recurrent major depressive disorder, in full remission ([place])  Is currently on celexa. Says it is working well for her. Depression screen [facility] 2/9 07/24/2018 04/15/2018 10/15/2017  Decreased Interest 0 1 1  Down, Depressed, Hopeless 0 1 1  PHQ - 2 Score 0 2 2  Altered sleeping - 1 1  Tired, decreased energy - 1 1  Change in appetite - 1 2  Feeling bad or failure about yourself  - 0 0  Trouble concentrating - 0 0  Moving slowly or fidgety/restless - 0 0  Suicidal thoughts - 0 0  PHQ-9 Score - 5 6  Difficult doing work/chores - Somewhat difficult -     4. Pure hypercholesterolemia  Does  not watch diet very closely and does very little exercise.    Outpatient Encounter Medications as of 07/24/2018  Medication Sig  . atorvastatin (LIPITOR) 40 MG tablet Take 1 tablet (40 mg total) by mouth daily.  . citalopram (CELEXA) 20 MG tablet Take 1 tablet (20 mg total) by mouth daily.  . hydrochlorothiazide (HYDRODIURIL) 25 MG tablet Take 1 tablet (25 mg total) by mouth daily.      New complaints: None today  Social history: Lives with wife "Mary-Margaret". Like to [employer] in his spare time.   Review of Systems  Constitutional: Negative for activity change and appetite change.  HENT: Negative.   Eyes: Negative for pain.  Respiratory: Negative for shortness of breath.   Cardiovascular: Negative for chest pain, palpitations and leg swelling.  Gastrointestinal: Negative for abdominal pain.  Endocrine: Negative for polydipsia.  Genitourinary: Negative.   Skin:  Negative for rash.  Neurological: Negative for dizziness, weakness and headaches.  Hematological: Does not bruise/bleed easily.  Psychiatric/Behavioral: Negative.   All other systems reviewed and are negative.      Objective:   Physical Exam  Constitutional: He is oriented to person, place, and time. He appears well-developed and well-nourished.  HENT:  Head: Normocephalic.  Nose: Nose normal.  Mouth/Throat: Oropharynx is clear and moist.  Eyes: Pupils are equal, round, and reactive to light. EOM are normal.  Neck: Normal range of motion and phonation normal. Neck supple. No JVD present. Carotid bruit is not present. No thyroid mass and no thyromegaly present.  Cardiovascular: Normal rate and regular rhythm.  Pulmonary/Chest: Effort normal and breath sounds normal. No respiratory distress.  Abdominal: Soft. Normal appearance, normal aorta and bowel sounds are normal. There is no tenderness.  Musculoskeletal: Normal range of motion.  Lymphadenopathy:    He has no cervical adenopathy.  Neurological: He is alert and oriented to person, place, and time.  Skin: Skin is warm and dry.  Psychiatric: He has a normal mood and affect. His behavior is normal. Judgment and thought content normal.  Nursing note and vitals reviewed.  BP 116/82   Pulse 73   Temp 97.9 F (36.6 C) (Oral)   Ht '6\' 3"'  (1.905 m)   Wt (!) 303 lb (137.4 kg)   BMI 37.87 kg/m  Assessment & Plan:  Peter Katz. comes in today with chief complaint of Medical Management of Chronic Issues   Diagnosis and orders addressed:  1. Essential hypertension Low sodium diet - hydrochlorothiazide (HYDRODIURIL) 25 MG tablet; Take 1 tablet (25 mg total) by mouth daily.  Dispense: 90 tablet; Refill: 1 - CMP14+EGFR  2. BMI 38.0-38.9,adult Discussed diet and exercise for person with BMI >25 Will recheck weight in 3-6 months  3. Recurrent major depressive disorder, in full remission ([place]) Stress management -  citalopram (CELEXA) 20 MG tablet; Take 1 tablet (20 mg total) by mouth daily.  Dispense: 90 tablet; Refill: 1  4. Pure hypercholesterolemia Low fat diet - atorvastatin (LIPITOR) 40 MG tablet; Take 1 tablet (40 mg total) by mouth daily.  Dispense: 90 tablet; Refill: 1 - Lipid panel  5. Prostate cancer screening - PSA, total and free   Labs pending Health Maintenance reviewed Diet and exercise encouraged  Follow up plan: 3 months   Mary-Margaret Hassell Done, FNP

## 2018-07-25 ENCOUNTER — Ambulatory Visit: Payer: BC Managed Care – PPO | Admitting: Nurse Practitioner

## 2018-07-25 LAB — CMP14+EGFR
ALBUMIN: 4.4 g/dL (ref 3.5–5.5)
ALK PHOS: 73 IU/L (ref 39–117)
ALT: 22 IU/L (ref 0–44)
AST: 15 IU/L (ref 0–40)
Albumin/Globulin Ratio: 1.6 (ref 1.2–2.2)
BILIRUBIN TOTAL: 0.5 mg/dL (ref 0.0–1.2)
BUN / CREAT RATIO: 10 (ref 9–20)
BUN: 12 mg/dL (ref 6–24)
CO2: 25 mmol/L (ref 20–29)
Calcium: 9.6 mg/dL (ref 8.7–10.2)
Chloride: 96 mmol/L (ref 96–106)
Creatinine, Ser: 1.19 mg/dL (ref 0.76–1.27)
GFR calc non Af Amer: 72 mL/min/{1.73_m2} (ref >59)
GFR, EST AFRICAN AMERICAN: 84 mL/min/{1.73_m2} (ref >59)
GLOBULIN, TOTAL: 2.7 g/dL (ref 1.5–4.5)
GLUCOSE: 124 mg/dL — AB (ref 65–99)
Potassium: 3.5 mmol/L (ref 3.5–5.2)
SODIUM: 138 mmol/L (ref 134–144)
TOTAL PROTEIN: 7.1 g/dL (ref 6.0–8.5)

## 2018-07-25 LAB — LIPID PANEL
Chol/HDL Ratio: 3.5 ratio (ref 0.0–5.0)
Cholesterol, Total: 149 mg/dL (ref 100–199)
HDL: 42 mg/dL (ref >39)
LDL CALC: 94 mg/dL (ref 0–99)
Triglycerides: 65 mg/dL (ref 0–149)
VLDL CHOLESTEROL CAL: 13 mg/dL (ref 5–40)

## 2018-07-25 LAB — PSA, TOTAL AND FREE
PROSTATE SPECIFIC AG, SERUM: 0.3 ng/mL (ref 0.0–4.0)
PSA FREE PCT: 30 %
PSA FREE: 0.09 ng/mL

## 2018-10-24 ENCOUNTER — Ambulatory Visit: Payer: BC Managed Care – PPO | Admitting: Nurse Practitioner

## 2018-11-14 ENCOUNTER — Telehealth (INDEPENDENT_AMBULATORY_CARE_PROVIDER_SITE_OTHER): Payer: BC Managed Care – PPO | Admitting: Nurse Practitioner

## 2018-11-14 ENCOUNTER — Other Ambulatory Visit: Payer: Self-pay

## 2018-11-14 DIAGNOSIS — Z6838 Body mass index (BMI) 38.0-38.9, adult: Secondary | ICD-10-CM

## 2018-11-14 DIAGNOSIS — E7800 Pure hypercholesterolemia, unspecified: Secondary | ICD-10-CM | POA: Diagnosis not present

## 2018-11-14 DIAGNOSIS — F3342 Major depressive disorder, recurrent, in full remission: Secondary | ICD-10-CM

## 2018-11-14 DIAGNOSIS — I10 Essential (primary) hypertension: Secondary | ICD-10-CM | POA: Diagnosis not present

## 2018-11-14 MED ORDER — CITALOPRAM HYDROBROMIDE 20 MG PO TABS: 20.0000 mg | ORAL_TABLET | Freq: Every day | ORAL | 1 refills | Status: DC

## 2018-11-14 MED ORDER — ATORVASTATIN CALCIUM 40 MG PO TABS: 40.0000 mg | ORAL_TABLET | Freq: Every day | ORAL | 1 refills | Status: DC

## 2018-11-14 MED ORDER — HYDROCHLOROTHIAZIDE 25 MG PO TABS: 25.0000 mg | ORAL_TABLET | Freq: Every day | ORAL | 1 refills | Status: DC

## 2018-11-14 NOTE — Progress Notes (Signed)
Virtual Visit via telephone Note  I connected with Peter Murray. on 11/14/18 at 8:40 AM by telephone and verified that I am speaking with the correct person using two identifiers. Peter Murray. is currently located at work and no one is currently with her during visit. The provider, Mary-Margaret Hassell Done, FNP is located in their office at time of visit.  I discussed the limitations, risks, security and privacy concerns of performing an evaluation and management service by telephone and the availability of in person appointments. I also discussed with the patient that there may be a patient responsible charge related to this service. The patient expressed understanding and agreed to proceed.   History and Present Illness:   Chief Complaint: medical management of chronic issues  HPI:  1. Essential hypertension  No c/o chest pain, sob or headache. Does not check blood pressure at home. BP Readings from Last 3 Encounters:  07/24/18 116/82  04/15/18 120/83  10/15/17 114/78     2. Pure hypercholesterolemia  Does not watch diet and does no exercise.  3. Recurrent major depressive disorder, in full remission ([place])  He is still taking his celexa without side effects.  4. BMI 38.0-38.9,adult  No weight changes    Outpatient Encounter Medications as of 11/14/2018  Medication Sig  . atorvastatin (LIPITOR) 40 MG tablet Take 1 tablet (40 mg total) by mouth daily.  . citalopram (CELEXA) 20 MG tablet Take 1 tablet (20 mg total) by mouth daily.  . hydrochlorothiazide (HYDRODIURIL) 25 MG tablet Take 1 tablet (25 mg total) by mouth daily.       New complaints: None today  Social history: Is working 2 days a week from home and in office 3 days a week.      History obtained from the patient General ROS: negative for - chills, fatigue, fever, malaise, sleep disturbance or weight loss Psychological ROS: negative for - anxiety, behavioral disorder or depression Respiratory  ROS: no cough, shortness of breath, or wheezing Cardiovascular ROS: no chest pain or dyspnea on exertion Gastrointestinal ROS: no abdominal pain, change in bowel habits, or black or bloody stools Genito-Urinary ROS: no dysuria, trouble voiding, or hematuria Neurological ROS: no TIA or stroke symptoms   Observations/Objective: Alert and oriented- answers all questions appropriately  Assessment and Plan: Peter Murray. comes in today with chief complaint of No chief complaint on file.   Diagnosis and orders addressed:  1. Essential hypertension Low sodium diet - hydrochlorothiazide (HYDRODIURIL) 25 MG tablet; Take 1 tablet (25 mg total) by mouth daily.  Dispense: 90 tablet; Refill: 1  2. Pure hypercholesterolemia Low fat diet - atorvastatin (LIPITOR) 40 MG tablet; Take 1 tablet (40 mg total) by mouth daily.  Dispense: 90 tablet; Refill: 1  3. Recurrent major depressive disorder, in full remission ([place]) Stress management - citalopram (CELEXA) 20 MG tablet; Take 1 tablet (20 mg total) by mouth daily.  Dispense: 90 tablet; Refill: 1  4. BMI 38.0-38.9,adult Discussed diet and exercise for person with BMI >25 Will recheck weight in 3-6 months   Previous lbs results reviewed Health Maintenance reviewed Diet and exercise encouraged     Follow Up Instructions: 6 months    I discussed the assessment and treatment plan with the patient. The patient was provided an opportunity to ask questions and all were answered. The patient agreed with the plan and demonstrated an understanding of the instructions.   The patient was advised to call back or seek an  in-person evaluation if the symptoms worsen or if the condition fails to improve as anticipated.  The above assessment and management plan was discussed with the patient. The patient verbalized understanding of and has agreed to the management plan. Patient is aware to call the clinic if symptoms persist or worsen. Patient is  aware when to return to the clinic for a follow-up visit. Patient educated on when it is appropriate to go to the emergency department.    I provided 10 minutes of non-face-to-face time during this encounter.    Mary-Margaret Hassell Done, FNP

## 2018-11-14 NOTE — Patient Instructions (Signed)

## 2019-01-23 ENCOUNTER — Encounter: Payer: Self-pay | Admitting: Family Medicine

## 2019-01-23 ENCOUNTER — Other Ambulatory Visit: Payer: Self-pay

## 2019-01-23 ENCOUNTER — Ambulatory Visit (INDEPENDENT_AMBULATORY_CARE_PROVIDER_SITE_OTHER): Payer: BC Managed Care – PPO | Admitting: Family Medicine

## 2019-01-23 DIAGNOSIS — L03116 Cellulitis of left lower limb: Secondary | ICD-10-CM

## 2019-01-23 MED ORDER — SULFAMETHOXAZOLE-TRIMETHOPRIM 800-160 MG PO TABS: 1.0000 | ORAL_TABLET | Freq: Two times a day (BID) | ORAL | 0 refills | Status: AC

## 2019-01-23 NOTE — Progress Notes (Signed)
Virtual Visit via telephone Note Due to COVID-19, visit is conducted virtually and was requested by patient. This visit type was conducted due to national recommendations for restrictions regarding the COVID-19 Pandemic (e.g. social distancing) in an effort to limit this patient's exposure and mitigate transmission in our community. All issues noted in this document were discussed and addressed.  A physical exam was not performed with this format.   I connected with Peter Katz. on 01/23/19 at 0915 by telephone and verified that I am speaking with the correct person using two identifiers. Peter Katz. is currently located at work and no one is currently with them during visit. The provider, Monia Pouch, FNP is located in their office at time of visit.  I discussed the limitations, risks, security and privacy concerns of performing an evaluation and management service by telephone and the availability of in person appointments. I also discussed with the patient that there may be a patient responsible charge related to this service. The patient expressed understanding and agreed to proceed.  Subjective:  Patient ID: Peter Katz., male    DOB: [birth year], 48 y.o.   MRN: [id number]  Chief Complaint:  Insect Bite (possible, knee swelling and redness)   HPI: Peter Mulford. is a 48 y.o. male presenting on 01/23/2019 for Insect Bite (possible, knee swelling and redness)   Pt states he has swelling, redness, and increased warmth in his left knee. States Saturday he was working in the flower bed and bent down in the mulch on his knees. States he has since developed the redness and swelling in his left knee. States the knee feels tight due to the swelling. He states he is unsure if he got bit by a spider or ant. No drainage from the site. States the site does have blistering near the middle of the redness. No fever, chills, weakness, or confusion.     Relevant past medical,  surgical, family, and social history reviewed and updated as indicated.  Allergies and medications reviewed and updated.   Past Medical History:  Diagnosis Date  . Hyperlipidemia   . Hypertension     Past Surgical History:  Procedure Laterality Date  . LAPAROSCOPIC GASTRIC BANDING      Social History   Socioeconomic History  . Marital status: Married    Spouse name: Not on file  . Number of children: Not on file  . Years of education: Not on file  . Highest education level: Not on file  Occupational History  . Not on file  Social Needs  . Financial resource strain: Not on file  . Food insecurity:    Worry: Not on file    Inability: Not on file  . Transportation needs:    Medical: Not on file    Non-medical: Not on file  Tobacco Use  . Smoking status: Current Every Day Smoker    Packs/day: 1.00    Years: 15.00    Pack years: 15.00    Types: Cigarettes    Last attempt to quit: 10/19/2009    Years since quitting: 9.2  . Smokeless tobacco: Never Used  Substance and Sexual Activity  . Alcohol use: No  . Drug use: No  . Sexual activity: Yes  Lifestyle  . Physical activity:    Days per week: Not on file    Minutes per session: Not on file  . Stress: Not on file  Relationships  . Social connections:  Talks on phone: Not on file    Gets together: Not on file    Attends religious service: Not on file    Active member of club or organization: Not on file    Attends meetings of clubs or organizations: Not on file    Relationship status: Not on file  . Intimate partner violence:    Fear of current or ex partner: Not on file    Emotionally abused: Not on file    Physically abused: Not on file    Forced sexual activity: Not on file  Other Topics Concern  . Not on file  Social History Narrative  . Not on file    Outpatient Encounter Medications as of 01/23/2019  Medication Sig  . atorvastatin (LIPITOR) 40 MG tablet Take 1 tablet (40 mg total) by mouth daily.  .  citalopram (CELEXA) 20 MG tablet Take 1 tablet (20 mg total) by mouth daily.  . hydrochlorothiazide (HYDRODIURIL) 25 MG tablet Take 1 tablet (25 mg total) by mouth daily.  Marland Kitchen sulfamethoxazole-trimethoprim (BACTRIM DS) 800-160 MG tablet Take 1 tablet by mouth 2 (two) times daily for 10 days.   No facility-administered encounter medications on file as of 01/23/2019.     No Known Allergies  Review of Systems  Constitutional: Negative for chills, fatigue and fever.  Respiratory: Negative for cough and shortness of breath.   Cardiovascular: Negative for chest pain, palpitations and leg swelling.  Musculoskeletal: Positive for joint swelling (left knee). Negative for arthralgias, gait problem and myalgias.  Skin: Positive for color change, rash and wound. Negative for pallor.  Neurological: Negative for weakness.  Psychiatric/Behavioral: Negative for confusion.  All other systems reviewed and are negative.        Observations/Objective: No vital signs or physical exam, this was a telephone or virtual health encounter.  Pt alert and oriented, answers all questions appropriately, and able to speak in full sentences.  Picture sent to MyChart, redness and swelling noted to left knee, no obvious wound or drainage on image  Assessment and Plan: [name] was seen today for insect bite.  Diagnoses and all orders for this visit:  Cellulitis of left knee Reported symptoms and image provided consistent with cellulitis, unknown cause. Symptomatic care discussed. Medications as prescribed. Report any new or worsening symptoms.  -     sulfamethoxazole-trimethoprim (BACTRIM DS) 800-160 MG tablet; Take 1 tablet by mouth 2 (two) times daily for 10 days.     Follow Up Instructions: Return if symptoms worsen or fail to improve.    I discussed the assessment and treatment plan with the patient. The patient was provided an opportunity to ask questions and all were answered. The patient agreed with the  plan and demonstrated an understanding of the instructions.   The patient was advised to call back or seek an in-person evaluation if the symptoms worsen or if the condition fails to improve as anticipated.  The above assessment and management plan was discussed with the patient. The patient verbalized understanding of and has agreed to the management plan. Patient is aware to call the clinic if symptoms persist or worsen. Patient is aware when to return to the clinic for a follow-up visit. Patient educated on when it is appropriate to go to the emergency department.    I provided 15 minutes of non-face-to-face time during this encounter. The call started at 0915. The call ended at 0930. The other time was used for coordination of care.    Monia Pouch, FNP-C  [place] [address] [place], [place] [address] [phone]

## 2019-06-03 ENCOUNTER — Other Ambulatory Visit: Payer: Self-pay | Admitting: Nurse Practitioner

## 2019-06-03 DIAGNOSIS — F3342 Major depressive disorder, recurrent, in full remission: Secondary | ICD-10-CM

## 2019-06-11 ENCOUNTER — Other Ambulatory Visit: Payer: Self-pay | Admitting: Nurse Practitioner

## 2019-06-11 DIAGNOSIS — I10 Essential (primary) hypertension: Secondary | ICD-10-CM

## 2019-06-30 ENCOUNTER — Other Ambulatory Visit: Payer: Self-pay | Admitting: Nurse Practitioner

## 2019-06-30 DIAGNOSIS — E7800 Pure hypercholesterolemia, unspecified: Secondary | ICD-10-CM

## 2019-07-03 ENCOUNTER — Other Ambulatory Visit: Payer: Self-pay | Admitting: Nurse Practitioner

## 2019-07-03 DIAGNOSIS — F3342 Major depressive disorder, recurrent, in full remission: Secondary | ICD-10-CM

## 2019-07-03 NOTE — Telephone Encounter (Signed)
Apt scheduled.  

## 2019-07-03 NOTE — Telephone Encounter (Signed)
MMM NTBS 30 days given 06/03/19

## 2019-07-07 ENCOUNTER — Encounter: Payer: Self-pay | Admitting: Nurse Practitioner

## 2019-07-07 ENCOUNTER — Ambulatory Visit (INDEPENDENT_AMBULATORY_CARE_PROVIDER_SITE_OTHER): Payer: BC Managed Care – PPO | Admitting: Nurse Practitioner

## 2019-07-07 DIAGNOSIS — F3342 Major depressive disorder, recurrent, in full remission: Secondary | ICD-10-CM

## 2019-07-07 DIAGNOSIS — Z6838 Body mass index (BMI) 38.0-38.9, adult: Secondary | ICD-10-CM

## 2019-07-07 DIAGNOSIS — I10 Essential (primary) hypertension: Secondary | ICD-10-CM | POA: Diagnosis not present

## 2019-07-07 DIAGNOSIS — E7800 Pure hypercholesterolemia, unspecified: Secondary | ICD-10-CM | POA: Diagnosis not present

## 2019-07-07 MED ORDER — ATORVASTATIN CALCIUM 40 MG PO TABS: 40.0000 mg | ORAL_TABLET | Freq: Every day | ORAL | 1 refills | Status: DC

## 2019-07-07 MED ORDER — CITALOPRAM HYDROBROMIDE 20 MG PO TABS: 20.0000 mg | ORAL_TABLET | Freq: Every day | ORAL | 1 refills | Status: DC

## 2019-07-07 MED ORDER — HYDROCHLOROTHIAZIDE 25 MG PO TABS: 25.0000 mg | ORAL_TABLET | Freq: Every day | ORAL | 1 refills | Status: DC

## 2019-07-07 NOTE — Progress Notes (Signed)
Virtual Visit via telephone Note Due to COVID-19 pandemic this visit was conducted virtually. This visit type was conducted due to national recommendations for restrictions regarding the COVID-19 Pandemic (e.g. social distancing, sheltering in place) in an effort to limit this patient's exposure and mitigate transmission in our community. All issues noted in this document were discussed and addressed.  A physical exam was not performed with this format.  I connected with Peter Katz. on 07/07/19 at 8:55 by telephone and verified that I am speaking with the correct person using two identifiers. Peter Katz. is currently located at home and n one is currently with  him during visit. The provider, Mary-Margaret Hassell Done, FNP is located in their office at time of visit.  I discussed the limitations, risks, security and privacy concerns of performing an evaluation and management service by telephone and the availability of in person appointments. I also discussed with the patient that there may be a patient responsible charge related to this service. The patient expressed understanding and agreed to proceed.   History and Present Illness:   Chief Complaint: Medical Management of Chronic Issues    HPI:  1. Essential hypertension No c/o chest pain, sob or headache. Does not check blood pressure at home. BP Readings from Last 3 Encounters:  07/24/18 116/82  04/15/18 120/83  10/15/17 114/78     2. Pure hypercholesterolemia Does not watch diet and does no exercise. Lab Results  Component Value Date   CHOL 149 07/24/2018   HDL 42 07/24/2018   LDLCALC 94 07/24/2018   TRIG 65 07/24/2018   CHOLHDL 3.5 07/24/2018     3. Recurrent major depressive disorder, in full remission ([place]) He is still tang celexa and is doing well.  Depression screen [facility] 2/9 07/07/2019 11/14/2018 07/24/2018  Decreased Interest 0 0 0  Down, Depressed, Hopeless 0 0 0  PHQ - 2 Score 0 0 0  Altered sleeping  - - -  Tired, decreased energy - - -  Change in appetite - - -  Feeling bad or failure about yourself  - - -  Trouble concentrating - - -  Moving slowly or fidgety/restless - - -  Suicidal thoughts - - -  PHQ-9 Score - - -  Difficult doing work/chores - - -     4. BMI 38.0-38.9,adult He denies any weight changes Wt Readings from Last 3 Encounters:  07/24/18 (!) 303 lb (137.4 kg)  04/15/18 (!) 303 lb 3.2 oz (137.5 kg)  10/15/17 (!) 304 lb (137.9 kg)   BMI Readings from Last 3 Encounters:  07/24/18 37.87 kg/m  04/15/18 37.90 kg/m  10/15/17 38.00 kg/m       Outpatient Encounter Medications as of 07/07/2019  Medication Sig  . atorvastatin (LIPITOR) 40 MG tablet Take 1 tablet (40 mg total) by mouth daily. (Needs to be seen before next refill)  . citalopram (CELEXA) 20 MG tablet Take 1 tablet (20 mg total) by mouth daily. (Needs to be seen before next refill)  . hydrochlorothiazide (HYDRODIURIL) 25 MG tablet Take 1 tablet (25 mg total) by mouth daily. (Needs to be seen before next refill)     Past Surgical History:  Procedure Laterality Date  . LAPAROSCOPIC GASTRIC BANDING      Family History  Problem Relation Age of Onset  . Heart disease Mother   . Hyperlipidemia Mother   . Hypertension Mother   . Diabetes Mother   . Heart disease Father   . Hyperlipidemia  Father   . Hypertension Father   . Stroke Father     New complaints: None today  Social history: [place]  Controlled substance contract: n/a  Review of Systems  Constitutional: Negative for diaphoresis and weight loss.  Eyes: Negative for blurred vision, double vision and pain.  Respiratory: Negative for shortness of breath.   Cardiovascular: Negative for chest pain, palpitations, orthopnea and leg swelling.  Gastrointestinal: Negative for abdominal pain.  Skin: Negative for rash.  Neurological: Negative for dizziness, sensory change, loss of consciousness,  weakness and headaches.  Endo/Heme/Allergies: Negative for polydipsia. Does not bruise/bleed easily.  Psychiatric/Behavioral: Negative for memory loss. The patient does not have insomnia.   All other systems reviewed and are negative.    Observations/Objective: Alert and oriented- answers all questions appropriately No distress   Assessment and Plan: Peter Katz. comes in today with chief complaint of Medical Management of Chronic Issues   Diagnosis and orders addressed:  1. Essential hypertension Low sodium diet - hydrochlorothiazide (HYDRODIURIL) 25 MG tablet; Take 1 tablet (25 mg total) by mouth daily. (Needs to be seen before next refill)  Dispense: 90 tablet; Refill: 1  2. Pure hypercholesterolemia Low fat diet - atorvastatin (LIPITOR) 40 MG tablet; Take 1 tablet (40 mg total) by mouth daily. (Needs to be seen before next refill)  Dispense: 90 tablet; Refill: 1  3. Recurrent major depressive disorder, in full remission ([place]) Stress management - citalopram (CELEXA) 20 MG tablet; Take 1 tablet (20 mg total) by mouth daily. (Needs to be seen before next refill)  Dispense: 90 tablet; Refill: 1  4. BMI 38.0-38.9,adult Discussed diet and exercise for person with BMI >25 Will recheck weight in 3-6 months   Labs pending Health Maintenance reviewed Diet and exercise encouraged  Follow up plan: 6 months     I discussed the assessment and treatment plan with the patient. The patient was provided an opportunity to ask questions and all were answered. The patient agreed with the plan and demonstrated an understanding of the instructions.   The patient was advised to call back or seek an in-person evaluation if the symptoms worsen or if the condition fails to improve as anticipated.  The above assessment and management plan was discussed with the patient. The patient verbalized understanding of and has agreed to the management plan. Patient is aware to call the clinic if  symptoms persist or worsen. Patient is aware when to return to the clinic for a follow-up visit. Patient educated on when it is appropriate to go to the emergency department.   Time call ended:  9:08 I provided 13 minutes of non-face-to-face time during this encounter.    Mary-Margaret Hassell Done, FNP

## 2020-01-05 ENCOUNTER — Other Ambulatory Visit: Payer: Self-pay | Admitting: Nurse Practitioner

## 2020-01-05 DIAGNOSIS — F3342 Major depressive disorder, recurrent, in full remission: Secondary | ICD-10-CM

## 2020-01-30 ENCOUNTER — Other Ambulatory Visit: Payer: Self-pay | Admitting: Nurse Practitioner

## 2020-01-30 DIAGNOSIS — F3342 Major depressive disorder, recurrent, in full remission: Secondary | ICD-10-CM

## 2020-01-31 ENCOUNTER — Other Ambulatory Visit: Payer: Self-pay | Admitting: Nurse Practitioner

## 2020-01-31 DIAGNOSIS — E7800 Pure hypercholesterolemia, unspecified: Secondary | ICD-10-CM

## 2020-01-31 DIAGNOSIS — I10 Essential (primary) hypertension: Secondary | ICD-10-CM

## 2020-02-03 ENCOUNTER — Encounter: Payer: Self-pay | Admitting: Nurse Practitioner

## 2020-02-16 ENCOUNTER — Ambulatory Visit (INDEPENDENT_AMBULATORY_CARE_PROVIDER_SITE_OTHER): Payer: BC Managed Care – PPO | Admitting: Nurse Practitioner

## 2020-02-16 ENCOUNTER — Encounter: Payer: Self-pay | Admitting: Nurse Practitioner

## 2020-02-16 ENCOUNTER — Other Ambulatory Visit: Payer: Self-pay

## 2020-02-16 VITALS — BP 124/82 | HR 73 | Temp 98.2°F | Resp 20 | Ht 75.0 in | Wt 310.0 lb

## 2020-02-16 DIAGNOSIS — F3342 Major depressive disorder, recurrent, in full remission: Secondary | ICD-10-CM

## 2020-02-16 DIAGNOSIS — I10 Essential (primary) hypertension: Secondary | ICD-10-CM

## 2020-02-16 DIAGNOSIS — Z6838 Body mass index (BMI) 38.0-38.9, adult: Secondary | ICD-10-CM | POA: Diagnosis not present

## 2020-02-16 DIAGNOSIS — E7800 Pure hypercholesterolemia, unspecified: Secondary | ICD-10-CM

## 2020-02-16 DIAGNOSIS — Z125 Encounter for screening for malignant neoplasm of prostate: Secondary | ICD-10-CM

## 2020-02-16 MED ORDER — HYDROCHLOROTHIAZIDE 25 MG PO TABS: 25.0000 mg | ORAL_TABLET | Freq: Every day | ORAL | 0 refills | Status: DC

## 2020-02-16 MED ORDER — CITALOPRAM HYDROBROMIDE 20 MG PO TABS: 20.0000 mg | ORAL_TABLET | Freq: Every day | ORAL | 0 refills | Status: DC

## 2020-02-16 MED ORDER — ATORVASTATIN CALCIUM 40 MG PO TABS: 40.0000 mg | ORAL_TABLET | Freq: Every day | ORAL | 0 refills | Status: DC

## 2020-02-16 NOTE — Patient Instructions (Signed)

## 2020-02-16 NOTE — Progress Notes (Signed)
Subjective:    Patient ID: Peter Katz., male    DOB: 1971/08/03, 49 y.o.   MRN: [id number]   Chief Complaint: medical management of chronic issues     HPI:  1. Essential hypertension No c/o chest pain, sob or headache. Does not check blood pressure at home. BP Readings from Last 3 Encounters:  07/24/18 116/82  04/15/18 120/83  10/15/17 114/78     2. Pure hypercholesterolemia Does not watch diet and does no dedicated exercise.  Lab Results  Component Value Date   CHOL 149 07/24/2018   HDL 42 07/24/2018   LDLCALC 94 07/24/2018   TRIG 65 07/24/2018   CHOLHDL 3.5 07/24/2018     3. Recurrent major depressive disorder, in full remission ([place]) Is on celexa and is doing well. He has been out for almost 2 weeks. May decide not to take anymore. Depression screen [facility] 2/9 02/16/2020 07/07/2019 11/14/2018  Decreased Interest 0 0 0  Down, Depressed, Hopeless 0 0 0  PHQ - 2 Score 0 0 0  Altered sleeping - - -  Tired, decreased energy - - -  Change in appetite - - -  Feeling bad or failure about yourself  - - -  Trouble concentrating - - -  Moving slowly or fidgety/restless - - -  Suicidal thoughts - - -  PHQ-9 Score - - -  Difficult doing work/chores - - -     4. BMI 38.0-38.9,adult Weight is up 7lbs from previous  Wt Readings from Last 3 Encounters:  02/16/20 (!) 310 lb (140.6 kg)  07/24/18 (!) 303 lb (137.4 kg)  04/15/18 (!) 303 lb 3.2 oz (137.5 kg)  ' BMI Readings from Last 3 Encounters:  02/16/20 38.75 kg/m  07/24/18 37.87 kg/m  04/15/18 37.90 kg/m    Outpatient Encounter Medications as of 02/16/2020  Medication Sig  . atorvastatin (LIPITOR) 40 MG tablet TAKE 1 TABLET (40 MG TOTAL) BY MOUTH DAILY. (NEEDS TO BE SEEN BEFORE NEXT REFILL)  . citalopram (CELEXA) 20 MG tablet TAKE 1 TABLET (20 MG TOTAL) BY MOUTH DAILY. (NEEDS TO BE SEEN BEFORE NEXT REFILL)  . hydrochlorothiazide (HYDRODIURIL) 25 MG tablet TAKE 1 TABLET (25 MG TOTAL) BY MOUTH DAILY. (NEEDS TO  BE SEEN BEFORE NEXT REFILL)     Past Surgical History:  Procedure Laterality Date  . LAPAROSCOPIC GASTRIC BANDING      Family History  Problem Relation Age of Onset  . Heart disease Mother   . Hyperlipidemia Mother   . Hypertension Mother   . Diabetes Mother   . Heart disease Father   . Hyperlipidemia Father   . Hypertension Father   . Stroke Father     New complaints: None today  Social history: Lives with wife  Controlled substance contract: n/a   Review of Systems  Constitutional: Negative for diaphoresis.  Eyes: Negative for pain.  Respiratory: Negative for shortness of breath.   Cardiovascular: Negative for chest pain, palpitations and leg swelling.  Gastrointestinal: Negative for abdominal pain.  Endocrine: Negative for polydipsia.  Skin: Negative for rash.  Neurological: Negative for dizziness, weakness and headaches.  Hematological: Does not bruise/bleed easily.  All other systems reviewed and are negative.      Objective:   Physical Exam Vitals and nursing note reviewed.  Constitutional:      Appearance: Normal appearance. He is well-developed.  HENT:     Head: Normocephalic.     Nose: Nose normal.  Eyes:     Pupils: Pupils  are equal, round, and reactive to light.  Neck:     Thyroid: No thyroid mass or thyromegaly.     Vascular: No carotid bruit or JVD.     Trachea: Phonation normal.  Cardiovascular:     Rate and Rhythm: Normal rate and regular rhythm.  Pulmonary:     Effort: Pulmonary effort is normal. No respiratory distress.     Breath sounds: Normal breath sounds.  Abdominal:     General: Bowel sounds are normal.     Palpations: Abdomen is soft.     Tenderness: There is no abdominal tenderness.  Musculoskeletal:        General: Normal range of motion.     Cervical back: Normal range of motion and neck supple.  Lymphadenopathy:     Cervical: No cervical adenopathy.  Skin:    General: Skin is warm and dry.  Neurological:     Mental  Status: He is alert and oriented to person, place, and time.  Psychiatric:        Behavior: Behavior normal.        Thought Content: Thought content normal.        Judgment: Judgment normal.    BP 124/82   Pulse 73   Temp 98.2 F (36.8 C) (Temporal)   Resp 20   Ht '6\' 3"'  (1.905 m)   Wt (!) 310 lb (140.6 kg)   SpO2 96%   BMI 38.75 kg/m        Assessment & Plan:  Peter Katz. comes in today with chief complaint of Medical Management of Chronic Issues   Diagnosis and orders addressed:  1. Essential hypertension Low sodium diet - CBC with Differential/Platelet - CMP14+EGFR - hydrochlorothiazide (HYDRODIURIL) 25 MG tablet; Take 1 tablet (25 mg total) by mouth daily. (Needs to be seen before next refill)  Dispense: 30 tablet; Refill: 0  2. Pure hypercholesterolemia Low fat diet - Lipid panel - atorvastatin (LIPITOR) 40 MG tablet; Take 1 tablet (40 mg total) by mouth daily. (Needs to be seen before next refill)  Dispense: 30 tablet; Refill: 0  3. Recurrent major depressive disorder, in full remission [facility]) He will fill clexa in case he needs it. He is going to try to stay off of it. - citalopram (CELEXA) 20 MG tablet; Take 1 tablet (20 mg total) by mouth daily. (Needs to be seen before next refill)  Dispense: 30 tablet; Refill: 0  4. BMI 38.0-38.9,adult Discussed diet and exercise for person with BMI >25 Will recheck weight in 3-6 months  5. Prostate cancer screening - PSA, total and free   Labs pending Health Maintenance reviewed Diet and exercise encouraged  Follow up plan: 6 months   [place], FNP

## 2020-02-17 LAB — CBC WITH DIFFERENTIAL/PLATELET
Basophils Absolute: 0.1 10*3/uL (ref 0.0–0.2)
Basos: 1 %
EOS (ABSOLUTE): 0.2 10*3/uL (ref 0.0–0.4)
Eos: 2 %
Hematocrit: 50.2 % (ref 37.5–51.0)
Hemoglobin: 17.3 g/dL (ref 13.0–17.7)
Immature Grans (Abs): 0 10*3/uL (ref 0.0–0.1)
Immature Granulocytes: 0 %
Lymphocytes Absolute: 3.1 10*3/uL (ref 0.7–3.1)
Lymphs: 41 %
MCH: 30.9 pg (ref 26.6–33.0)
MCHC: 34.5 g/dL (ref 31.5–35.7)
MCV: 90 fL (ref 79–97)
Monocytes Absolute: 0.8 10*3/uL (ref 0.1–0.9)
Monocytes: 11 %
Neutrophils Absolute: 3.4 10*3/uL (ref 1.4–7.0)
Neutrophils: 45 %
Platelets: 260 10*3/uL (ref 150–450)
RBC: 5.6 x10E6/uL (ref 4.14–5.80)
RDW: 13 % (ref 11.6–15.4)
WBC: 7.5 10*3/uL (ref 3.4–10.8)

## 2020-02-17 LAB — CMP14+EGFR
ALT: 23 IU/L (ref 0–44)
AST: 23 IU/L (ref 0–40)
Albumin/Globulin Ratio: 1.8 (ref 1.2–2.2)
Albumin: 4.6 g/dL (ref 4.0–5.0)
Alkaline Phosphatase: 75 IU/L (ref 48–121)
BUN/Creatinine Ratio: 13 (ref 9–20)
BUN: 14 mg/dL (ref 6–24)
Bilirubin Total: 1.1 mg/dL (ref 0.0–1.2)
CO2: 28 mmol/L (ref 20–29)
Calcium: 9.7 mg/dL (ref 8.7–10.2)
Chloride: 95 mmol/L — ABNORMAL LOW (ref 96–106)
Creatinine, Ser: 1.09 mg/dL (ref 0.76–1.27)
GFR calc Af Amer: 92 mL/min/{1.73_m2} (ref >59)
GFR calc non Af Amer: 80 mL/min/{1.73_m2} (ref >59)
Globulin, Total: 2.6 g/dL (ref 1.5–4.5)
Glucose: 95 mg/dL (ref 65–99)
Potassium: 3.9 mmol/L (ref 3.5–5.2)
Sodium: 138 mmol/L (ref 134–144)
Total Protein: 7.2 g/dL (ref 6.0–8.5)

## 2020-02-17 LAB — PSA, TOTAL AND FREE
PSA, Free Pct: 26.7 %
PSA, Free: 0.08 ng/mL
Prostate Specific Ag, Serum: 0.3 ng/mL (ref 0.0–4.0)

## 2020-02-17 LAB — LIPID PANEL
Chol/HDL Ratio: 4 ratio (ref 0.0–5.0)
Cholesterol, Total: 139 mg/dL (ref 100–199)
HDL: 35 mg/dL — ABNORMAL LOW (ref >39)
LDL Chol Calc (NIH): 84 mg/dL (ref 0–99)
Triglycerides: 108 mg/dL (ref 0–149)
VLDL Cholesterol Cal: 20 mg/dL (ref 5–40)

## 2020-03-01 ENCOUNTER — Other Ambulatory Visit: Payer: Self-pay | Admitting: Nurse Practitioner

## 2020-03-01 DIAGNOSIS — E7800 Pure hypercholesterolemia, unspecified: Secondary | ICD-10-CM

## 2020-03-01 DIAGNOSIS — I10 Essential (primary) hypertension: Secondary | ICD-10-CM

## 2020-04-27 ENCOUNTER — Other Ambulatory Visit: Payer: Self-pay

## 2020-04-27 DIAGNOSIS — Z20822 Contact with and (suspected) exposure to covid-19: Secondary | ICD-10-CM

## 2020-04-30 LAB — NOVEL CORONAVIRUS, NAA: SARS-CoV-2, NAA: NOT DETECTED

## 2020-05-04 ENCOUNTER — Other Ambulatory Visit: Payer: Self-pay

## 2020-05-04 DIAGNOSIS — Z20822 Contact with and (suspected) exposure to covid-19: Secondary | ICD-10-CM

## 2020-05-06 LAB — NOVEL CORONAVIRUS, NAA: SARS-CoV-2, NAA: NOT DETECTED

## 2020-05-06 LAB — SARS-COV-2, NAA 2 DAY TAT

## 2020-05-11 ENCOUNTER — Other Ambulatory Visit: Payer: Self-pay

## 2020-05-11 DIAGNOSIS — Z20822 Contact with and (suspected) exposure to covid-19: Secondary | ICD-10-CM

## 2020-05-13 LAB — NOVEL CORONAVIRUS, NAA: SARS-CoV-2, NAA: NOT DETECTED

## 2020-05-13 LAB — SARS-COV-2, NAA 2 DAY TAT

## 2020-05-18 ENCOUNTER — Other Ambulatory Visit: Payer: Self-pay

## 2020-05-18 DIAGNOSIS — I471 Supraventricular tachycardia: Secondary | ICD-10-CM

## 2020-05-19 LAB — NOVEL CORONAVIRUS, NAA: SARS-CoV-2, NAA: NOT DETECTED

## 2020-05-19 LAB — SARS-COV-2, NAA 2 DAY TAT

## 2020-05-25 ENCOUNTER — Other Ambulatory Visit: Payer: Self-pay

## 2020-05-25 DIAGNOSIS — Z20822 Contact with and (suspected) exposure to covid-19: Secondary | ICD-10-CM

## 2020-05-27 LAB — NOVEL CORONAVIRUS, NAA: SARS-CoV-2, NAA: NOT DETECTED

## 2020-05-27 LAB — SARS-COV-2, NAA 2 DAY TAT

## 2020-06-01 ENCOUNTER — Other Ambulatory Visit: Payer: Self-pay

## 2020-06-01 DIAGNOSIS — Z20822 Contact with and (suspected) exposure to covid-19: Secondary | ICD-10-CM

## 2020-06-02 LAB — NOVEL CORONAVIRUS, NAA: SARS-CoV-2, NAA: NOT DETECTED

## 2020-06-02 LAB — SARS-COV-2, NAA 2 DAY TAT

## 2020-06-08 ENCOUNTER — Other Ambulatory Visit: Payer: Self-pay

## 2020-06-08 DIAGNOSIS — Z20822 Contact with and (suspected) exposure to covid-19: Secondary | ICD-10-CM

## 2020-06-09 LAB — NOVEL CORONAVIRUS, NAA: SARS-CoV-2, NAA: NOT DETECTED

## 2020-06-09 LAB — SARS-COV-2, NAA 2 DAY TAT

## 2020-06-15 ENCOUNTER — Other Ambulatory Visit: Payer: Self-pay

## 2020-06-15 DIAGNOSIS — Z20822 Contact with and (suspected) exposure to covid-19: Secondary | ICD-10-CM

## 2020-06-16 LAB — NOVEL CORONAVIRUS, NAA: SARS-CoV-2, NAA: NOT DETECTED

## 2020-06-16 LAB — SARS-COV-2, NAA 2 DAY TAT

## 2020-06-22 ENCOUNTER — Other Ambulatory Visit: Payer: Self-pay

## 2020-06-22 DIAGNOSIS — Z20822 Contact with and (suspected) exposure to covid-19: Secondary | ICD-10-CM

## 2020-06-23 LAB — NOVEL CORONAVIRUS, NAA: SARS-CoV-2, NAA: NOT DETECTED

## 2020-06-23 LAB — SARS-COV-2, NAA 2 DAY TAT

## 2020-06-29 ENCOUNTER — Other Ambulatory Visit: Payer: Self-pay

## 2020-06-29 DIAGNOSIS — Z20822 Contact with and (suspected) exposure to covid-19: Secondary | ICD-10-CM

## 2020-07-01 LAB — NOVEL CORONAVIRUS, NAA: SARS-CoV-2, NAA: NOT DETECTED

## 2020-07-01 LAB — SARS-COV-2, NAA 2 DAY TAT

## 2020-07-06 ENCOUNTER — Other Ambulatory Visit: Payer: Self-pay

## 2020-07-06 DIAGNOSIS — Z20822 Contact with and (suspected) exposure to covid-19: Secondary | ICD-10-CM

## 2020-07-07 LAB — SARS-COV-2, NAA 2 DAY TAT

## 2020-07-07 LAB — NOVEL CORONAVIRUS, NAA: SARS-CoV-2, NAA: NOT DETECTED

## 2020-07-13 ENCOUNTER — Other Ambulatory Visit: Payer: Self-pay

## 2020-07-13 DIAGNOSIS — Z20822 Contact with and (suspected) exposure to covid-19: Secondary | ICD-10-CM

## 2020-07-15 LAB — NOVEL CORONAVIRUS, NAA: SARS-CoV-2, NAA: NOT DETECTED

## 2020-07-15 LAB — SARS-COV-2, NAA 2 DAY TAT

## 2020-07-20 ENCOUNTER — Other Ambulatory Visit: Payer: Self-pay

## 2020-07-20 DIAGNOSIS — Z20822 Contact with and (suspected) exposure to covid-19: Secondary | ICD-10-CM

## 2020-07-21 LAB — SARS-COV-2, NAA 2 DAY TAT

## 2020-07-21 LAB — NOVEL CORONAVIRUS, NAA: SARS-CoV-2, NAA: NOT DETECTED

## 2020-07-27 ENCOUNTER — Other Ambulatory Visit: Payer: Self-pay

## 2020-07-27 DIAGNOSIS — Z20822 Contact with and (suspected) exposure to covid-19: Secondary | ICD-10-CM

## 2020-07-29 LAB — SARS-COV-2, NAA 2 DAY TAT

## 2020-07-29 LAB — NOVEL CORONAVIRUS, NAA: SARS-CoV-2, NAA: NOT DETECTED

## 2020-08-03 ENCOUNTER — Other Ambulatory Visit: Payer: Self-pay

## 2020-08-03 DIAGNOSIS — Z20822 Contact with and (suspected) exposure to covid-19: Secondary | ICD-10-CM

## 2020-08-04 LAB — NOVEL CORONAVIRUS, NAA: SARS-CoV-2, NAA: NOT DETECTED

## 2020-08-04 LAB — SARS-COV-2, NAA 2 DAY TAT

## 2020-08-10 ENCOUNTER — Other Ambulatory Visit: Payer: Self-pay

## 2020-08-10 DIAGNOSIS — Z20822 Contact with and (suspected) exposure to covid-19: Secondary | ICD-10-CM

## 2020-08-12 LAB — SARS-COV-2, NAA 2 DAY TAT

## 2020-08-12 LAB — NOVEL CORONAVIRUS, NAA: SARS-CoV-2, NAA: NOT DETECTED

## 2020-08-18 ENCOUNTER — Ambulatory Visit: Payer: Self-pay | Admitting: Nurse Practitioner

## 2020-08-19 ENCOUNTER — Other Ambulatory Visit: Payer: Self-pay

## 2020-08-19 DIAGNOSIS — Z20822 Contact with and (suspected) exposure to covid-19: Secondary | ICD-10-CM

## 2020-08-21 LAB — SARS-COV-2, NAA 2 DAY TAT

## 2020-08-21 LAB — NOVEL CORONAVIRUS, NAA: SARS-CoV-2, NAA: DETECTED — AB

## 2020-08-31 ENCOUNTER — Other Ambulatory Visit: Payer: Self-pay

## 2020-08-31 ENCOUNTER — Ambulatory Visit: Payer: BC Managed Care – PPO | Admitting: Nurse Practitioner

## 2020-08-31 ENCOUNTER — Encounter: Payer: Self-pay | Admitting: Nurse Practitioner

## 2020-08-31 VITALS — BP 117/77 | HR 67 | Temp 98.0°F | Resp 20 | Ht 75.0 in | Wt 301.0 lb

## 2020-08-31 DIAGNOSIS — F3342 Major depressive disorder, recurrent, in full remission: Secondary | ICD-10-CM | POA: Diagnosis not present

## 2020-08-31 DIAGNOSIS — I10 Essential (primary) hypertension: Secondary | ICD-10-CM

## 2020-08-31 DIAGNOSIS — Z6838 Body mass index (BMI) 38.0-38.9, adult: Secondary | ICD-10-CM

## 2020-08-31 DIAGNOSIS — E7800 Pure hypercholesterolemia, unspecified: Secondary | ICD-10-CM | POA: Diagnosis not present

## 2020-08-31 DIAGNOSIS — Z9884 Bariatric surgery status: Secondary | ICD-10-CM

## 2020-08-31 LAB — CBC WITH DIFFERENTIAL/PLATELET
Basophils Absolute: 0.1 10*3/uL (ref 0.0–0.2)
Basos: 1 %
EOS (ABSOLUTE): 0.2 10*3/uL (ref 0.0–0.4)
Eos: 4 %
Hematocrit: 47.7 % (ref 37.5–51.0)
Hemoglobin: 16.4 g/dL (ref 13.0–17.7)
Immature Grans (Abs): 0 10*3/uL (ref 0.0–0.1)
Immature Granulocytes: 0 %
Lymphocytes Absolute: 2.2 10*3/uL (ref 0.7–3.1)
Lymphs: 50 %
MCH: 29.2 pg (ref 26.6–33.0)
MCHC: 34.4 g/dL (ref 31.5–35.7)
MCV: 85 fL (ref 79–97)
Monocytes Absolute: 0.5 10*3/uL (ref 0.1–0.9)
Monocytes: 11 %
Neutrophils Absolute: 1.5 10*3/uL (ref 1.4–7.0)
Neutrophils: 34 %
Platelets: 278 10*3/uL (ref 150–450)
RBC: 5.61 x10E6/uL (ref 4.14–5.80)
RDW: 12.7 % (ref 11.6–15.4)
WBC: 4.4 10*3/uL (ref 3.4–10.8)

## 2020-08-31 LAB — CMP14+EGFR
ALT: 34 IU/L (ref 0–44)
AST: 29 IU/L (ref 0–40)
Albumin/Globulin Ratio: 1.7 (ref 1.2–2.2)
Albumin: 4.3 g/dL (ref 4.0–5.0)
Alkaline Phosphatase: 72 IU/L (ref 44–121)
BUN/Creatinine Ratio: 6 — ABNORMAL LOW (ref 9–20)
BUN: 7 mg/dL (ref 6–24)
Bilirubin Total: 0.7 mg/dL (ref 0.0–1.2)
CO2: 28 mmol/L (ref 20–29)
Calcium: 9.4 mg/dL (ref 8.7–10.2)
Chloride: 99 mmol/L (ref 96–106)
Creatinine, Ser: 1.19 mg/dL (ref 0.76–1.27)
GFR calc Af Amer: 82 mL/min/{1.73_m2} (ref >59)
GFR calc non Af Amer: 71 mL/min/{1.73_m2} (ref >59)
Globulin, Total: 2.5 g/dL (ref 1.5–4.5)
Glucose: 112 mg/dL — ABNORMAL HIGH (ref 65–99)
Potassium: 3.7 mmol/L (ref 3.5–5.2)
Sodium: 140 mmol/L (ref 134–144)
Total Protein: 6.8 g/dL (ref 6.0–8.5)

## 2020-08-31 LAB — LIPID PANEL
Chol/HDL Ratio: 5 ratio (ref 0.0–5.0)
Cholesterol, Total: 134 mg/dL (ref 100–199)
HDL: 27 mg/dL — ABNORMAL LOW (ref >39)
LDL Chol Calc (NIH): 88 mg/dL (ref 0–99)
Triglycerides: 102 mg/dL (ref 0–149)
VLDL Cholesterol Cal: 19 mg/dL (ref 5–40)

## 2020-08-31 MED ORDER — ATORVASTATIN CALCIUM 40 MG PO TABS: 40.0000 mg | ORAL_TABLET | Freq: Every day | ORAL | 1 refills | Status: DC

## 2020-08-31 MED ORDER — HYDROCHLOROTHIAZIDE 25 MG PO TABS: 25.0000 mg | ORAL_TABLET | Freq: Every day | ORAL | 1 refills | Status: DC

## 2020-08-31 NOTE — Patient Instructions (Signed)
Exercising to Stay Healthy To become healthy and stay healthy, it is recommended that you do moderate-intensity and vigorous-intensity exercise. You can tell that you are exercising at a moderate intensity if your heart starts beating faster and you start breathing faster but can still hold a conversation. You can tell that you are exercising at a vigorous intensity if you are breathing much harder and faster and cannot hold a conversation while exercising. Exercising regularly is important. It has many health benefits, such as:  Improving overall fitness, flexibility, and endurance.  Increasing bone density.  Helping with weight control.  Decreasing body fat.  Increasing muscle strength.  Reducing stress and tension.  Improving overall health. How often should I exercise? Choose an activity that you enjoy, and set realistic goals. Your health care provider can help you make an activity plan that works for you. Exercise regularly as told by your health care provider. This may include:  Doing strength training two times a week, such as: ? Lifting weights. ? Using resistance bands. ? Push-ups. ? Sit-ups. ? Yoga.  Doing a certain intensity of exercise for a given amount of time. Choose from these options: ? A total of 150 minutes of moderate-intensity exercise every week. ? A total of 75 minutes of vigorous-intensity exercise every week. ? A mix of moderate-intensity and vigorous-intensity exercise every week. Children, pregnant women, people who have not exercised regularly, people who are overweight, and older adults may need to talk with a health care provider about what activities are safe to do. If you have a medical condition, be sure to talk with your health care provider before you start a new exercise program. What are some exercise ideas? Moderate-intensity exercise ideas include:  Walking 1 mile (1.6 km) in about 15  minutes.  Biking.  Hiking.  Golfing.  Dancing.  Water aerobics. Vigorous-intensity exercise ideas include:  Walking 4.5 miles (7.2 km) or more in about 1 hour.  Jogging or running 5 miles (8 km) in about 1 hour.  Biking 10 miles (16.1 km) or more in about 1 hour.  Lap swimming.  Roller-skating or in-line skating.  Cross-country skiing.  Vigorous competitive sports, such as football, basketball, and soccer.  Jumping rope.  Aerobic dancing.   What are some everyday activities that can help me to get exercise?  Yard work, such as: ? Pushing a lawn mower. ? Raking and bagging leaves.  Washing your car.  Pushing a stroller.  Shoveling snow.  Gardening.  Washing windows or floors. How can I be more active in my day-to-day activities?  Use stairs instead of an elevator.  Take a walk during your lunch break.  If you drive, park your car farther away from your work or school.  If you take public transportation, get off one stop early and walk the rest of the way.  Stand up or walk around during all of your indoor phone calls.  Get up, stretch, and walk around every 30 minutes throughout the day.  Enjoy exercise with a friend. Support to continue exercising will help you keep a regular routine of activity. What guidelines can I follow while exercising?  Before you start a new exercise program, talk with your health care provider.  Do not exercise so much that you hurt yourself, feel dizzy, or get very short of breath.  Wear comfortable clothes and wear shoes with good support.  Drink plenty of water while you exercise to prevent dehydration or heat stroke.  Work out until   your breathing and your heartbeat get faster. Where to find more information  [place] Department of Health and Human Services: www.hhs.gov  Centers for Disease Control and Prevention (CDC): www.cdc.gov Summary  Exercising regularly is important. It will improve your overall fitness,  flexibility, and endurance.  Regular exercise also will improve your overall health. It can help you control your weight, reduce stress, and improve your bone density.  Do not exercise so much that you hurt yourself, feel dizzy, or get very short of breath.  Before you start a new exercise program, talk with your health care provider. This information is not intended to replace advice given to you by your health care provider. Make sure you discuss any questions you have with your health care provider. Document Revised: 07/20/2017 Document Reviewed: 06/28/2017 Elsevier Patient Education  2021 Elsevier Inc.  

## 2020-08-31 NOTE — Progress Notes (Signed)
Subjective:    Patient ID: Peter Murray., male    DOB: 1971-06-19, 50 y.o.   MRN: [id number]   Chief Complaint: Medical Management of Chronic Issues    HPI:  1. Primary hypertension No c/o chest pain, sob or headache.. does not check blood pressure at home. BP Readings from Last 3 Encounters:  02/16/20 124/82  07/24/18 116/82  04/15/18 120/83     2. Pure hypercholesterolemia Doe snot watch diet and does little to no exercise. He says he stays very busy though an does not seat around a lot at home. Lab Results  Component Value Date   CHOL 139 02/16/2020   HDL 35 (L) 02/16/2020   LDLCALC 84 02/16/2020   TRIG 108 02/16/2020   CHOLHDL 4.0 02/16/2020   The 10-year ASCVD risk score Mikey Bussing DC Jr., et al., 2013) is: 6.9%   Values used to calculate the score:     Age: 31 years     Sex: Male     Is Non-Hispanic African American: No     Diabetic: No     Tobacco smoker: Yes     Systolic Blood Pressure: 678 mmHg     Is BP treated: Yes     HDL Cholesterol: 35 mg/dL     Total Cholesterol: 139 mg/dL   3. Recurrent major depressive disorder, in full remission ([place]) He is not on his celexa anymore. He weaned hisself off of meds. He says he is doing well. Depression screen [facility] 2/9 08/31/2020 02/16/2020 07/07/2019  Decreased Interest 0 0 0  Down, Depressed, Hopeless 0 0 0  PHQ - 2 Score 0 0 0  Altered sleeping 0 - -  Tired, decreased energy 0 - -  Change in appetite 0 - -  Feeling bad or failure about yourself  0 - -  Trouble concentrating 0 - -  Moving slowly or fidgety/restless 0 - -  Suicidal thoughts 0 - -  PHQ-9 Score 0 - -  Difficult doing work/chores Not difficult at all - -     4. BMI 38.0-38.9,adult Weight is up 7lbs since last visit Wt Readings from Last 3 Encounters:  02/16/20 (!) 310 lb (140.6 kg)  07/24/18 (!) 303 lb (137.4 kg)  04/15/18 (!) 303 lb 3.2 oz (137.5 kg)   BMI Readings from Last 3 Encounters:  02/16/20 38.75 kg/m  07/24/18 37.87 kg/m   04/15/18 37.90 kg/m       Outpatient Encounter Medications as of 08/31/2020  Medication Sig  . atorvastatin (LIPITOR) 40 MG tablet Take 1 tablet (40 mg total) by mouth daily.  . citalopram (CELEXA) 20 MG tablet Take 1 tablet (20 mg total) by mouth daily. (Needs to be seen before next refill)  . hydrochlorothiazide (HYDRODIURIL) 25 MG tablet Take 1 tablet (25 mg total) by mouth daily.     Past Surgical History:  Procedure Laterality Date  . LAPAROSCOPIC GASTRIC BANDING      Family History  Problem Relation Age of Onset  . Heart disease Father   . Hyperlipidemia Father   . Hypertension Father   . Stroke Father   . Heart disease Mother   . Hyperlipidemia Mother   . Hypertension Mother   . 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  Eyes: Negative for pain.  Respiratory: Negative for shortness of breath.  Cardiovascular: Negative for chest pain, palpitations and leg swelling.  Gastrointestinal: Negative for abdominal pain.  Endocrine: Negative for polydipsia.  Skin: Negative for rash.  Neurological: Negative for dizziness, weakness and headaches.  Hematological: Does not bruise/bleed easily.  All other systems reviewed and are negative.      Objective:   Physical Exam Vitals and nursing note reviewed.  Constitutional:      Appearance: Normal appearance. He is well-developed and well-nourished.  HENT:     Head: Normocephalic.     Nose: Nose normal.     Mouth/Throat:     Mouth: Oropharynx is clear and moist.  Eyes:     Extraocular Movements: EOM normal.     Pupils: Pupils are equal, round, and reactive to light.  Neck:     Thyroid: No thyroid mass or thyromegaly.     Vascular: No carotid bruit or JVD.     Trachea: Phonation normal.  Cardiovascular:     Rate and Rhythm: Normal rate and  regular rhythm.  Pulmonary:     Effort: Pulmonary effort is normal. No respiratory distress.     Breath sounds: Normal breath sounds.  Abdominal:     General: Bowel sounds are normal. Aorta is normal.     Palpations: Abdomen is soft.     Tenderness: There is no abdominal tenderness.  Musculoskeletal:        General: Normal range of motion.     Cervical back: Normal range of motion and neck supple.  Lymphadenopathy:     Cervical: No cervical adenopathy.  Skin:    General: Skin is warm and dry.  Neurological:     Mental Status: He is alert and oriented to person, place, and time.  Psychiatric:        Mood and Affect: Mood and affect normal.        Behavior: Behavior normal.        Thought Content: Thought content normal.        Judgment: Judgment normal.    BP 117/77   Pulse 67   Temp 98 F (36.7 C) (Temporal)   Resp 20   Ht 6\' 3"  (1.905 m)   Wt (!) 301 lb (136.5 kg)   SpO2 97%   BMI 37.62 kg/m        Assessment & Plan:  Peter Murray. comes in today with chief complaint of Medical Management of Chronic Issues   Diagnosis and orders addressed:  1. Primary hypertension Low sodium diet - hydrochlorothiazide (HYDRODIURIL) 25 MG tablet; Take 1 tablet (25 mg total) by mouth daily.  Dispense: 90 tablet; Refill: 1  2. Pure hypercholesterolemia Low fat diet - atorvastatin (LIPITOR) 40 MG tablet; Take 1 tablet (40 mg total) by mouth daily.  Dispense: 90 tablet; Refill: 1  3. Recurrent major depressive disorder, in full remission ([place]) Stress management  4. BMI 38.0-38.9,adult Discussed diet and exercise for person with BMI >25 Will recheck weight in 3-6 months  5. Hx of laparoscopic gastric banding - Amb Referral to Bariatric Surgery   Labs pending Health Maintenance reviewed Diet and exercise encouraged  Follow up plan: 6 months   Prue, FNP

## 2020-12-09 ENCOUNTER — Other Ambulatory Visit: Payer: Self-pay | Admitting: Surgery

## 2020-12-09 DIAGNOSIS — Z9884 Bariatric surgery status: Secondary | ICD-10-CM

## 2020-12-23 ENCOUNTER — Ambulatory Visit
Admission: RE | Admit: 2020-12-23 | Discharge: 2020-12-23 | Disposition: A | Discharge status: Home / Self Care | Payer: BC Managed Care – PPO | Source: Ambulatory Visit | Attending: Surgery | Admitting: Surgery

## 2020-12-23 ENCOUNTER — Other Ambulatory Visit: Payer: Self-pay | Admitting: Surgery

## 2020-12-23 DIAGNOSIS — Z9884 Bariatric surgery status: Secondary | ICD-10-CM

## 2021-02-27 ENCOUNTER — Other Ambulatory Visit: Payer: Self-pay | Admitting: Nurse Practitioner

## 2021-02-27 DIAGNOSIS — I10 Essential (primary) hypertension: Secondary | ICD-10-CM

## 2021-03-04 ENCOUNTER — Other Ambulatory Visit: Payer: Self-pay | Admitting: Nurse Practitioner

## 2021-03-04 DIAGNOSIS — E7800 Pure hypercholesterolemia, unspecified: Secondary | ICD-10-CM

## 2021-03-07 ENCOUNTER — Ambulatory Visit (INDEPENDENT_AMBULATORY_CARE_PROVIDER_SITE_OTHER): Payer: BC Managed Care – PPO | Admitting: Nurse Practitioner

## 2021-03-07 ENCOUNTER — Encounter: Payer: Self-pay | Admitting: Nurse Practitioner

## 2021-03-07 VITALS — BP 116/77 | HR 60 | Temp 97.9°F | Resp 20 | Ht 75.0 in | Wt 305.0 lb

## 2021-03-07 DIAGNOSIS — I10 Essential (primary) hypertension: Secondary | ICD-10-CM

## 2021-03-07 DIAGNOSIS — Z6838 Body mass index (BMI) 38.0-38.9, adult: Secondary | ICD-10-CM | POA: Diagnosis not present

## 2021-03-07 DIAGNOSIS — F3342 Major depressive disorder, recurrent, in full remission: Secondary | ICD-10-CM

## 2021-03-07 DIAGNOSIS — E7800 Pure hypercholesterolemia, unspecified: Secondary | ICD-10-CM | POA: Diagnosis not present

## 2021-03-07 MED ORDER — ATORVASTATIN CALCIUM 40 MG PO TABS: 40.0000 mg | ORAL_TABLET | Freq: Every day | ORAL | 1 refills | Status: DC

## 2021-03-07 NOTE — Progress Notes (Signed)
Subjective:    Patient ID: Peter Murray., male    DOB: [birth year], 50 y.o.   MRN: [id number]  Chief Complaint: medical management of chronic issues      HPI:  1. Primary hypertension No c/o chest pain, sob or headache. Does not check blood pressure at home. BP Readings from Last 3 Encounters:  03/07/21 116/77  08/31/20 117/77  02/16/20 124/82      2. Pure hypercholesterolemia Does not watch diet and does little to no exercise. Lab Results  Component Value Date   CHOL 134 08/31/2020   HDL 27 (L) 08/31/2020   LDLCALC 88 08/31/2020   TRIG 102 08/31/2020   CHOLHDL 5.0 08/31/2020    3. Recurrent major depressive disorder, in full remission ([place]) Denies any depression. Is on no prescription medication. Depression screen [facility] 2/9 03/07/2021 08/31/2020 02/16/2020  Decreased Interest 0 0 0  Down, Depressed, Hopeless 0 0 0  PHQ - 2 Score 0 0 0  Altered sleeping 0 0 -  Tired, decreased energy 1 0 -  Change in appetite 1 0 -  Feeling bad or failure about yourself  0 0 -  Trouble concentrating 0 0 -  Moving slowly or fidgety/restless 0 0 -  Suicidal thoughts 0 0 -  PHQ-9 Score 2 0 -  Difficult doing work/chores Not difficult at all Not difficult at all -      4. BMI 38.0-38.9,adult No recent weight changes  Wt Readings from Last 3 Encounters:  03/07/21 (!) 305 lb (138.3 kg)  08/31/20 (!) 301 lb (136.5 kg)  02/16/20 (!) 310 lb (140.6 kg)   BMI Readings from Last 3 Encounters:  03/07/21 38.12 kg/m  08/31/20 37.62 kg/m  02/16/20 38.75 kg/m      Outpatient Encounter Medications as of 03/07/2021  Medication Sig   atorvastatin (LIPITOR) 40 MG tablet Take 1 tablet (40 mg total) by mouth daily.   hydrochlorothiazide (HYDRODIURIL) 25 MG tablet TAKE 1 TABLET (25 MG TOTAL) BY MOUTH DAILY.   No facility-administered encounter medications on file as of 03/07/2021.    Past Surgical History:  Procedure Laterality Date   LAPAROSCOPIC GASTRIC BANDING      Family  History  Problem Relation Age of Onset   Heart disease Father    Hyperlipidemia Father    Hypertension Father    Stroke Father    Heart disease Mother    Hyperlipidemia Mother    Hypertension Mother    Diabetes Mother     New complaints: None today  Social history: Lives with wife  Controlled substance contract: n/a     Review of Systems  Constitutional:  Negative for diaphoresis.  Eyes:  Negative for pain.  Respiratory:  Negative for shortness of breath.   Cardiovascular:  Negative for chest pain, palpitations and leg swelling.  Gastrointestinal:  Negative for abdominal pain.  Endocrine: Negative for polydipsia.  Skin:  Negative for rash.  Neurological:  Negative for dizziness, weakness and headaches.  Hematological:  Does not bruise/bleed easily.  All other systems reviewed and are negative.     Objective:   Physical Exam Vitals and nursing note reviewed.  Constitutional:      Appearance: Normal appearance. He is well-developed.  HENT:     Head: Normocephalic.     Nose: Nose normal.  Eyes:     Pupils: Pupils are equal, round, and reactive to light.  Neck:     Thyroid: No thyroid mass or thyromegaly.     Vascular: No  carotid bruit or JVD.     Trachea: Phonation normal.  Cardiovascular:     Rate and Rhythm: Normal rate and regular rhythm.  Pulmonary:     Effort: Pulmonary effort is normal. No respiratory distress.     Breath sounds: Normal breath sounds.  Abdominal:     General: Bowel sounds are normal.     Palpations: Abdomen is soft.     Tenderness: There is no abdominal tenderness.  Musculoskeletal:        General: Normal range of motion.     Cervical back: Normal range of motion and neck supple.  Lymphadenopathy:     Cervical: No cervical adenopathy.  Skin:    General: Skin is warm and dry.  Neurological:     Mental Status: He is alert and oriented to person, place, and time.  Psychiatric:        Behavior: Behavior normal.        Thought  Content: Thought content normal.        Judgment: Judgment normal.    BP 116/77   Pulse 60   Temp 97.9 F (36.6 C) (Temporal)   Resp 20   Ht '6\' 3"'  (1.905 m)   Wt (!) 305 lb (138.3 kg)   SpO2 94%   BMI 38.12 kg/m        Assessment & Plan:   Peter Murray. comes in today with chief complaint of Medical Management of Chronic Issues   Diagnosis and orders addressed:  1. Primary hypertension Low sodium diet - CBC with Differential/Platelet - CMP14+EGFR  2. Pure hypercholesterolemia Low fat diet - atorvastatin (LIPITOR) 40 MG tablet; Take 1 tablet (40 mg total) by mouth daily.  Dispense: 90 tablet; Refill: 1 - Lipid panel  3. Recurrent major depressive disorder, in full remission ([place]) Stress manaagement  4. BMI 38.0-38.9,adult Discussed diet and exercise for person with BMI >25 Will recheck weight in 3-6 months    Labs pending Health Maintenance reviewed Diet and exercise encouraged  Follow up plan: 6 months   Mary-Margaret Hassell Done, FNP

## 2021-03-07 NOTE — Patient Instructions (Signed)
Textbook of family medicine (9th ed., pp. 1062-1073). [place], [place]: [employer].">  Stress, Adult Stress is a normal reaction to life events. Stress is what you feel when life demands more than you are used to, or more than you think you can handle. Some stress can be useful, such as studying for a test or meeting a deadline at work. Stress that occurs too often or for too long can cause problems. It can affect your emotional health and interfere with relationships and normal daily activities. Too much stress can weaken your body's defense system (immune system) and increase your risk for physical illness. If you already have a medicalproblem, stress can make it worse. What are the causes? All sorts of life events can cause stress. An event that causes stress for one person may not be stressful for another person. Major life events, whether positive or negative, commonly cause stress. Examples include: Losing a job or starting a new job. Losing a loved one. Moving to a new town or home. Getting married or divorced. Having a baby. Getting injured or sick. Less obvious life events can also cause stress, especially if they occur day after day or in combination with each other. Examples include: Working long hours. Driving in traffic. Caring for children. Being in debt. Being in a difficult relationship. What are the signs or symptoms? Stress can cause emotional symptoms, including: Anxiety. This is feeling worried, afraid, on edge, overwhelmed, or out of control. Anger, including irritation or impatience. Depression. This is feeling sad, down, helpless, or guilty. Trouble focusing, remembering, or making decisions. Stress can cause physical symptoms, including: Aches and pains. These may affect your head, neck, back, stomach, or other areas of your body. Tight muscles or a clenched jaw. Low energy. Trouble sleeping. Stress can cause unhealthy behaviors, including: Eating to feel better  (overeating) or skipping meals. Working too much or putting off tasks. Smoking, drinking alcohol, or using drugs to feel better. How is this diagnosed? Stress is diagnosed through an assessment by your health care provider. He or she may diagnose this condition based on: Your symptoms and any stressful life events. Your medical history. Tests to rule out other causes of your symptoms. Depending on your condition, your health care provider may refer you to aspecialist for further evaluation. How is this treated?  Stress management techniques are the recommended treatment for stress. Medicineis not typically recommended for the treatment of stress. Techniques to reduce your reaction to stressful life events include: Stress identification. Monitor yourself for symptoms of stress and identify what causes stress for you. These skills may help you to avoid or prepare for stressful events. Time management. Set your priorities, keep a calendar of events, and learn to say no. Taking these actions can help you avoid making too many commitments. Techniques for coping with stress include: Rethinking the problem. Try to think realistically about stressful events rather than ignoring them or overreacting. Try to find the positives in a stressful situation rather than focusing on the negatives. Exercise. Physical exercise can release both physical and emotional tension. The key is to find a form of exercise that you enjoy and do it regularly. Relaxation techniques. These relax the body and mind. The key is to find one or more that you enjoy and use the techniques regularly. Examples include: Meditation, deep breathing, or progressive relaxation techniques. Yoga or tai chi. Biofeedback, mindfulness techniques, or journaling. Listening to music, being out in nature, or participating in other hobbies. Practicing a healthy lifestyle.   Eat a balanced diet, drink plenty of water, limit or avoid caffeine, and get  plenty of sleep. Having a strong support network. Spend time with family, friends, or other people you enjoy being around. Express your feelings and talk things over with someone you trust. Counseling or talk therapy with a mental health professional may be helpful if you are havingtrouble managing stress on your own. Follow these instructions at home: Lifestyle  Avoid drugs. Do not use any products that contain nicotine or tobacco, such as cigarettes, e-cigarettes, and chewing tobacco. If you need help quitting, ask your health care provider. Limit alcohol intake to no more than 1 drink a day for nonpregnant women and 2 drinks a day for men. One drink equals 12 oz of beer, 5 oz of wine, or 1 oz of hard liquor Do not use alcohol or drugs to relax. Eat a balanced diet that includes fresh fruits and vegetables, whole grains, lean meats, fish, eggs, and beans, and low-fat dairy. Avoid processed foods and foods high in added fat, sugar, and salt. Exercise at least 30 minutes on 5 or more days each week. Get 7-8 hours of sleep each night.  General instructions  Practice stress management techniques as discussed with your health care provider. Drink enough fluid to keep your urine clear or pale yellow. Take over-the-counter and prescription medicines only as told by your health care provider. Keep all follow-up visits as told by your health care provider. This is important.  Contact a health care provider if: Your symptoms get worse. You have new symptoms. You feel overwhelmed by your problems and can no longer manage them on your own. Get help right away if: You have thoughts of hurting yourself or others. If you ever feel like you may hurt yourself or others, or have thoughts about taking your own life, get help right away. You can go to your nearest emergency department or call: Your local emergency services (911 in the [place] A suicide crisis helpline, such as the [facility] at 4253100524. This is open 24 hours a day. Summary Stress is a normal reaction to life events. It can cause problems if it happens too often or for too long. Practicing stress management techniques is the best way to treat stress. Counseling or talk therapy with a mental health professional may be helpful if you are having trouble managing stress on your own. This information is not intended to replace advice given to you by your health care provider. Make sure you discuss any questions you have with your healthcare provider. Document Revised: 04/23/2020 Document Reviewed: 04/23/2020 Elsevier Patient Education  2022 [employer].

## 2021-03-08 LAB — CMP14+EGFR
ALT: 21 IU/L (ref 0–44)
AST: 19 IU/L (ref 0–40)
Albumin/Globulin Ratio: 1.8 (ref 1.2–2.2)
Albumin: 4.4 g/dL (ref 4.0–5.0)
Alkaline Phosphatase: 60 IU/L (ref 44–121)
BUN/Creatinine Ratio: 8 — ABNORMAL LOW (ref 9–20)
BUN: 10 mg/dL (ref 6–24)
Bilirubin Total: 0.6 mg/dL (ref 0.0–1.2)
CO2: 27 mmol/L (ref 20–29)
Calcium: 8.7 mg/dL (ref 8.7–10.2)
Chloride: 99 mmol/L (ref 96–106)
Creatinine, Ser: 1.22 mg/dL (ref 0.76–1.27)
Globulin, Total: 2.5 g/dL (ref 1.5–4.5)
Glucose: 91 mg/dL (ref 65–99)
Potassium: 4.3 mmol/L (ref 3.5–5.2)
Sodium: 142 mmol/L (ref 134–144)
Total Protein: 6.9 g/dL (ref 6.0–8.5)
eGFR: 73 mL/min/{1.73_m2} (ref >59)

## 2021-03-08 LAB — CBC WITH DIFFERENTIAL/PLATELET
Basophils Absolute: 0.1 10*3/uL (ref 0.0–0.2)
Basos: 1 %
EOS (ABSOLUTE): 0.2 10*3/uL (ref 0.0–0.4)
Eos: 3 %
Hematocrit: 50.8 % (ref 37.5–51.0)
Hemoglobin: 17.5 g/dL (ref 13.0–17.7)
Immature Grans (Abs): 0 10*3/uL (ref 0.0–0.1)
Immature Granulocytes: 1 %
Lymphocytes Absolute: 2.6 10*3/uL (ref 0.7–3.1)
Lymphs: 40 %
MCH: 30.6 pg (ref 26.6–33.0)
MCHC: 34.4 g/dL (ref 31.5–35.7)
MCV: 89 fL (ref 79–97)
Monocytes Absolute: 0.6 10*3/uL (ref 0.1–0.9)
Monocytes: 10 %
Neutrophils Absolute: 2.9 10*3/uL (ref 1.4–7.0)
Neutrophils: 45 %
Platelets: 247 10*3/uL (ref 150–450)
RBC: 5.71 x10E6/uL (ref 4.14–5.80)
RDW: 13.1 % (ref 11.6–15.4)
WBC: 6.4 10*3/uL (ref 3.4–10.8)

## 2021-03-08 LAB — LIPID PANEL
Chol/HDL Ratio: 3.4 ratio (ref 0.0–5.0)
Cholesterol, Total: 127 mg/dL (ref 100–199)
HDL: 37 mg/dL — ABNORMAL LOW (ref >39)
LDL Chol Calc (NIH): 75 mg/dL (ref 0–99)
Triglycerides: 72 mg/dL (ref 0–149)
VLDL Cholesterol Cal: 15 mg/dL (ref 5–40)

## 2021-06-03 ENCOUNTER — Other Ambulatory Visit: Payer: Self-pay | Admitting: Nurse Practitioner

## 2021-06-03 DIAGNOSIS — I10 Essential (primary) hypertension: Secondary | ICD-10-CM

## 2021-09-01 ENCOUNTER — Other Ambulatory Visit: Payer: Self-pay | Admitting: Nurse Practitioner

## 2021-09-01 DIAGNOSIS — I10 Essential (primary) hypertension: Secondary | ICD-10-CM

## 2021-09-01 MED ORDER — HYDROCHLOROTHIAZIDE 25 MG PO TABS: 25.0000 mg | ORAL_TABLET | Freq: Every day | ORAL | 0 refills | Status: DC

## 2021-09-08 ENCOUNTER — Encounter: Payer: Self-pay | Admitting: Nurse Practitioner

## 2021-09-08 ENCOUNTER — Ambulatory Visit: Payer: BC Managed Care – PPO | Admitting: Nurse Practitioner

## 2021-09-08 VITALS — BP 116/81 | HR 69 | Temp 98.0°F | Resp 20 | Ht 75.0 in | Wt 300.0 lb

## 2021-09-08 DIAGNOSIS — Z1211 Encounter for screening for malignant neoplasm of colon: Secondary | ICD-10-CM

## 2021-09-08 DIAGNOSIS — I10 Essential (primary) hypertension: Secondary | ICD-10-CM

## 2021-09-08 DIAGNOSIS — E7800 Pure hypercholesterolemia, unspecified: Secondary | ICD-10-CM | POA: Diagnosis not present

## 2021-09-08 DIAGNOSIS — Z125 Encounter for screening for malignant neoplasm of prostate: Secondary | ICD-10-CM

## 2021-09-08 DIAGNOSIS — F3342 Major depressive disorder, recurrent, in full remission: Secondary | ICD-10-CM

## 2021-09-08 DIAGNOSIS — Z6838 Body mass index (BMI) 38.0-38.9, adult: Secondary | ICD-10-CM | POA: Diagnosis not present

## 2021-09-08 DIAGNOSIS — Z1212 Encounter for screening for malignant neoplasm of rectum: Secondary | ICD-10-CM

## 2021-09-08 MED ORDER — ATORVASTATIN CALCIUM 40 MG PO TABS: 40.0000 mg | ORAL_TABLET | Freq: Every day | ORAL | 1 refills | Status: DC

## 2021-09-08 MED ORDER — HYDROCHLOROTHIAZIDE 25 MG PO TABS: 25.0000 mg | ORAL_TABLET | Freq: Every day | ORAL | 1 refills | Status: DC

## 2021-09-08 NOTE — Patient Instructions (Signed)

## 2021-09-08 NOTE — Progress Notes (Signed)
Subjective:    Patient ID: Peter Murray., male    DOB: [birth year], 51 y.o.   MRN: [id number]   Chief Complaint: No chief complaint on file.    HPI:  Peter Murray. is a 51 y.o. who identifies as a male who was assigned male at birth.   Social history: Lives with: wife and step kids Work history: Librarian, academic   Comes in today for follow up of the following chronic medical issues:  1. Primary hypertension No c/o chest pain, sob or headache. Doe snot check blood pressure at home. BP Readings from Last 3 Encounters:  03/07/21 116/77  08/31/20 117/77  02/16/20 124/82     2. Pure hypercholesterolemia Doe stry to [facility] diet but does no dedicated exercise. Lab Results  Component Value Date   CHOL 127 03/07/2021   HDL 37 (L) 03/07/2021   LDLCALC 75 03/07/2021   TRIG 72 03/07/2021   CHOLHDL 3.4 03/07/2021  The ASCVD Risk score (Arnett DK, et al., 2019) failed to calculate for the following reasons:   The valid total cholesterol range is 130 to 320 mg/dL    3. Recurrent major depressive disorder, in full remission ([place]) Has had depression in th epast, but is currently doing well on no medication. Depression screen [facility] 2/9 09/08/2021 03/07/2021 08/31/2020  Decreased Interest 1 0 0  Down, Depressed, Hopeless 0 0 0  PHQ - 2 Score 1 0 0  Altered sleeping 0 0 0  Tired, decreased energy 0 1 0  Change in appetite 0 1 0  Feeling bad or failure about yourself  0 0 0  Trouble concentrating 0 0 0  Moving slowly or fidgety/restless 0 0 0  Suicidal thoughts 0 0 0  PHQ-9 Score 1 2 0  Difficult doing work/chores Not difficult at all Not difficult at all Not difficult at all     4. BMI 38.0-38.9,adult Weight is down 5lbs Wt Readings from Last 3 Encounters:  09/08/21 300 lb (136.1 kg)  03/07/21 (!) 305 lb (138.3 kg)  08/31/20 (!) 301 lb (136.5 kg)   BMI Readings from Last 3 Encounters:  09/08/21 37.50 kg/m  03/07/21 38.12 kg/m  08/31/20 37.62 kg/m     New  complaints: None today  No Known Allergies Outpatient Encounter Medications as of 09/08/2021  Medication Sig   atorvastatin (LIPITOR) 40 MG tablet Take 1 tablet (40 mg total) by mouth daily.   hydrochlorothiazide (HYDRODIURIL) 25 MG tablet Take 1 tablet (25 mg total) by mouth daily.   No facility-administered encounter medications on file as of 09/08/2021.    Past Surgical History:  Procedure Laterality Date   LAPAROSCOPIC GASTRIC BANDING      Family History  Problem Relation Age of Onset   Heart disease Father    Hyperlipidemia Father    Hypertension Father    Stroke Father    Heart disease Mother    Hyperlipidemia Mother    Hypertension Mother    Diabetes Mother       Controlled substance contract: n/a     Review of Systems  Constitutional:  Negative for diaphoresis.  Eyes:  Negative for pain.  Respiratory:  Negative for shortness of breath.   Cardiovascular:  Negative for chest pain, palpitations and leg swelling.  Gastrointestinal:  Negative for abdominal pain.  Endocrine: Negative for polydipsia.  Skin:  Negative for rash.  Neurological:  Negative for dizziness, weakness and headaches.  Hematological:  Does not bruise/bleed easily.  All other systems reviewed  and are negative.     Objective:   Physical Exam Vitals and nursing note reviewed.  Constitutional:      Appearance: Normal appearance. He is well-developed.  HENT:     Head: Normocephalic.     Nose: Nose normal.     Mouth/Throat:     Mouth: Mucous membranes are moist.     Pharynx: Oropharynx is clear.  Eyes:     Pupils: Pupils are equal, round, and reactive to light.  Neck:     Thyroid: No thyroid mass or thyromegaly.     Vascular: No carotid bruit or JVD.     Trachea: Phonation normal.  Cardiovascular:     Rate and Rhythm: Normal rate and regular rhythm.  Pulmonary:     Effort: Pulmonary effort is normal. No respiratory distress.     Breath sounds: Normal breath sounds.  Abdominal:      General: Bowel sounds are normal.     Palpations: Abdomen is soft.     Tenderness: There is no abdominal tenderness.  Musculoskeletal:        General: Normal range of motion.     Cervical back: Normal range of motion and neck supple.  Lymphadenopathy:     Cervical: No cervical adenopathy.  Skin:    General: Skin is warm and dry.  Neurological:     Mental Status: He is alert and oriented to person, place, and time.  Psychiatric:        Behavior: Behavior normal.        Thought Content: Thought content normal.        Judgment: Judgment normal.    BP 116/81    Pulse 69    Temp 98 F (36.7 C) (Temporal)    Resp 20    Ht '6\' 3"'  (1.905 m)    Wt 300 lb (136.1 kg)    SpO2 96%    BMI 37.50 kg/m        Assessment & Plan:  Peter Murray. comes in today with chief complaint of Medical Management of Chronic Issues   Diagnosis and orders addressed:  1. Primary hypertension Low sodium diet - hydrochlorothiazide (HYDRODIURIL) 25 MG tablet; Take 1 tablet (25 mg total) by mouth daily.  Dispense: 90 tablet; Refill: 1 - CBC with Differential/Platelet - CMP14+EGFR  2. Pure hypercholesterolemia Low fat diet - atorvastatin (LIPITOR) 40 MG tablet; Take 1 tablet (40 mg total) by mouth daily.  Dispense: 90 tablet; Refill: 1 - Lipid panel  3. Recurrent major depressive disorder, in full remission ([place]) Stress management  4. BMI 38.0-38.9,adult Discussed diet and exercise for person with BMI >25 Will recheck weight in 3-6 months   5. Prostate cancer screening Labs pending - PSA, total and free  6. Encounter for screening for colorectal malignant neoplasm - Ambulatory referral to Gastroenterology   Labs pending Health Maintenance reviewed Diet and exercise encouraged  Follow up plan: 6 months    Mary-Margaret Hassell Done, FNP

## 2021-09-09 LAB — PSA, TOTAL AND FREE
PSA, Free Pct: 22.5 %
PSA, Free: 0.09 ng/mL
Prostate Specific Ag, Serum: 0.4 ng/mL (ref 0.0–4.0)

## 2021-09-09 LAB — CMP14+EGFR
ALT: 22 IU/L (ref 0–44)
AST: 17 IU/L (ref 0–40)
Albumin/Globulin Ratio: 1.7 (ref 1.2–2.2)
Albumin: 4.5 g/dL (ref 4.0–5.0)
Alkaline Phosphatase: 69 IU/L (ref 44–121)
BUN/Creatinine Ratio: 7 — ABNORMAL LOW (ref 9–20)
BUN: 8 mg/dL (ref 6–24)
Bilirubin Total: 0.4 mg/dL (ref 0.0–1.2)
CO2: 27 mmol/L (ref 20–29)
Calcium: 9.8 mg/dL (ref 8.7–10.2)
Chloride: 95 mmol/L — ABNORMAL LOW (ref 96–106)
Creatinine, Ser: 1.18 mg/dL (ref 0.76–1.27)
Globulin, Total: 2.7 g/dL (ref 1.5–4.5)
Glucose: 126 mg/dL — ABNORMAL HIGH (ref 70–99)
Potassium: 3.9 mmol/L (ref 3.5–5.2)
Sodium: 138 mmol/L (ref 134–144)
Total Protein: 7.2 g/dL (ref 6.0–8.5)
eGFR: 75 mL/min/{1.73_m2} (ref >59)

## 2021-09-09 LAB — CBC WITH DIFFERENTIAL/PLATELET
Basophils Absolute: 0.1 10*3/uL (ref 0.0–0.2)
Basos: 1 %
EOS (ABSOLUTE): 0.3 10*3/uL (ref 0.0–0.4)
Eos: 4 %
Hematocrit: 51.6 % — ABNORMAL HIGH (ref 37.5–51.0)
Hemoglobin: 17.7 g/dL (ref 13.0–17.7)
Immature Grans (Abs): 0 10*3/uL (ref 0.0–0.1)
Immature Granulocytes: 0 %
Lymphocytes Absolute: 2.8 10*3/uL (ref 0.7–3.1)
Lymphs: 43 %
MCH: 29.8 pg (ref 26.6–33.0)
MCHC: 34.3 g/dL (ref 31.5–35.7)
MCV: 87 fL (ref 79–97)
Monocytes Absolute: 0.5 10*3/uL (ref 0.1–0.9)
Monocytes: 8 %
Neutrophils Absolute: 2.8 10*3/uL (ref 1.4–7.0)
Neutrophils: 44 %
Platelets: 262 10*3/uL (ref 150–450)
RBC: 5.93 x10E6/uL — ABNORMAL HIGH (ref 4.14–5.80)
RDW: 12.7 % (ref 11.6–15.4)
WBC: 6.4 10*3/uL (ref 3.4–10.8)

## 2021-09-09 LAB — LIPID PANEL
Chol/HDL Ratio: 3.5 ratio (ref 0.0–5.0)
Cholesterol, Total: 138 mg/dL (ref 100–199)
HDL: 40 mg/dL (ref >39)
LDL Chol Calc (NIH): 85 mg/dL (ref 0–99)
Triglycerides: 63 mg/dL (ref 0–149)
VLDL Cholesterol Cal: 13 mg/dL (ref 5–40)

## 2021-09-16 ENCOUNTER — Encounter: Payer: Self-pay | Admitting: Gastroenterology

## 2021-10-12 ENCOUNTER — Other Ambulatory Visit: Payer: Self-pay

## 2021-10-12 ENCOUNTER — Ambulatory Visit (AMBULATORY_SURGERY_CENTER): Payer: BC Managed Care – PPO | Admitting: *Deleted

## 2021-10-12 VITALS — Ht 75.0 in | Wt 293.0 lb

## 2021-10-12 DIAGNOSIS — Z1211 Encounter for screening for malignant neoplasm of colon: Secondary | ICD-10-CM

## 2021-10-12 NOTE — Progress Notes (Signed)

## 2021-10-20 ENCOUNTER — Encounter: Payer: Self-pay | Admitting: Gastroenterology

## 2021-10-22 ENCOUNTER — Other Ambulatory Visit: Payer: Self-pay

## 2021-10-22 ENCOUNTER — Emergency Department (HOSPITAL_BASED_OUTPATIENT_CLINIC_OR_DEPARTMENT_OTHER)
Admission: EM | Admit: 2021-10-22 | Discharge: 2021-10-22 | Disposition: A | Discharge status: Home / Self Care | Payer: BC Managed Care – PPO | Attending: Emergency Medicine | Admitting: Emergency Medicine

## 2021-10-22 ENCOUNTER — Encounter (HOSPITAL_BASED_OUTPATIENT_CLINIC_OR_DEPARTMENT_OTHER): Payer: Self-pay | Admitting: Emergency Medicine

## 2021-10-22 ENCOUNTER — Emergency Department (HOSPITAL_BASED_OUTPATIENT_CLINIC_OR_DEPARTMENT_OTHER): Payer: BC Managed Care – PPO | Admitting: Radiology

## 2021-10-22 DIAGNOSIS — Z79899 Other long term (current) drug therapy: Secondary | ICD-10-CM | POA: Diagnosis not present

## 2021-10-22 DIAGNOSIS — X501XXA Overexertion from prolonged static or awkward postures, initial encounter: Secondary | ICD-10-CM | POA: Diagnosis not present

## 2021-10-22 DIAGNOSIS — S93402A Sprain of unspecified ligament of left ankle, initial encounter: Secondary | ICD-10-CM | POA: Insufficient documentation

## 2021-10-22 DIAGNOSIS — Y9301 Activity, walking, marching and hiking: Secondary | ICD-10-CM | POA: Insufficient documentation

## 2021-10-22 DIAGNOSIS — M25572 Pain in left ankle and joints of left foot: Secondary | ICD-10-CM | POA: Insufficient documentation

## 2021-10-22 DIAGNOSIS — S99912A Unspecified injury of left ankle, initial encounter: Secondary | ICD-10-CM | POA: Diagnosis present

## 2021-10-22 DIAGNOSIS — I10 Essential (primary) hypertension: Secondary | ICD-10-CM | POA: Insufficient documentation

## 2021-10-22 MED ORDER — IBUPROFEN 400 MG PO TABS
600.0000 mg | ORAL_TABLET | Freq: Once | ORAL | Status: AC
  Administered 2021-10-22: 600 mg via ORAL
  Filled 2021-10-22: qty 1

## 2021-10-22 NOTE — ED Provider Notes (Signed)
?[facility] ?Provider Note ? ? ?CSN: [id number] ?Arrival date & time: 10/22/21  1300 ? ?  ? ?History ? ?Chief Complaint  ?Patient presents with  ? Ankle Pain  ? ? ?Peter Murray. is a 51 y.o. male. ? ?HPI ?51 year old male presents with left lateral ankle injury.  He was walking and twisted his ankle and heard a pop.  He has been able to put some weight on it though it is painful.  Has noticed lateral swelling almost immediately.  Took some Tylenol prior to arrival. ? ?Home Medications ?Prior to Admission medications   ?Medication Sig Start Date End Date Taking? Authorizing Provider  ?atorvastatin (LIPITOR) 40 MG tablet Take 1 tablet (40 mg total) by mouth daily. 09/08/21   Chevis Pretty, FNP  ?fluorouracil (EFUDEX) 5 % cream fluorouracil 5 % topical cream    [provider]  ?hydrochlorothiazide (HYDRODIURIL) 25 MG tablet Take 1 tablet (25 mg total) by mouth daily. 09/08/21   Chevis Pretty, FNP  ?Multiple Vitamin (MULTI-VITAMIN) tablet Take 1 tablet by mouth daily.    [provider]  ?   ? ?Allergies    ?Patient has no known allergies.   ? ?Review of Systems   ?Review of Systems  ?Musculoskeletal:  Positive for arthralgias and joint swelling.  ? ?Physical Exam ?Updated Vital Signs ?BP (!) 151/111 (BP Location: Left Arm)   Pulse (!) 107   Temp 98.7 ?F (37.1 ?C)   Resp 18   Ht '6\' 3"'$  (1.905 m)   Wt 133.8 kg   SpO2 96%   BMI 36.87 kg/m?  ?Physical Exam ?Vitals and nursing note reviewed.  ?Constitutional:   ?   Appearance: He is well-developed.  ?HENT:  ?   Head: Normocephalic and atraumatic.  ?Cardiovascular:  ?   Rate and Rhythm: Normal rate and regular rhythm.  ?   Pulses:     ?     Dorsalis pedis pulses are 2+ on the left side.  ?Pulmonary:  ?   Effort: Pulmonary effort is normal.  ?Musculoskeletal:  ?   Left lower leg: No tenderness.  ?   Left ankle: Swelling (lateral) present. Tenderness present over the lateral malleolus. Normal range of  motion.  ?   Left Achilles Tendon: No tenderness or defects.  ?Skin: ?   General: Skin is warm and dry.  ?Neurological:  ?   Mental Status: He is alert.  ? ? ?ED Results / Procedures / Treatments   ?Labs ?(all labs ordered are listed, but only abnormal results are displayed) ?Labs Reviewed - No data to display ? ?EKG ?None ? ?Radiology ?DG Ankle Complete Left ? ?Result Date: 10/22/2021 ?CLINICAL DATA:  Stepped in hole, rolled ankle, felt pop, pain and swelling over lateral malleolus EXAM: LEFT ANKLE COMPLETE - 3+ VIEW COMPARISON:  None. FINDINGS: There is no evidence of fracture, dislocation, or joint effusion. There is no evidence of arthropathy or other focal bone abnormality. Soft tissue edema over the lateral malleolus. IMPRESSION: No fracture or dislocation of the left ankle. Soft tissue edema over the lateral malleolus. Electronically Signed   By: Delanna Ahmadi M.D.   On: 10/22/2021 13:53   ? ?Procedures ?Procedures  ? ? ?Medications Ordered in ED ?Medications  ?ibuprofen (ADVIL) tablet 600 mg (has no administration in time range)  ? ? ?ED Course/ Medical Decision Making/ A&P ?  ?                        ?  Medical Decision Making ?Amount and/or Complexity of Data Reviewed ?Radiology: ordered. ? ? ?Patient's x-ray images reviewed by myself and there is no obvious fracture seen.  Seems like he has a ligament sprain.  He has been able to ambulate on and has full range of motion so I suspect its not severe.  We will put in a cam boot and have him follow-up with [facility], who he has seen in the past.  Otherwise recommend NSAIDs and he was given ibuprofen and ice here.  Instructed to elevate.  Has a strong pulse and no indication of neurovascular compromise.  Discharged home ? ? ? ? ? ? ? ?Final Clinical Impression(s) / ED Diagnoses ?Final diagnoses:  ?Sprain of left ankle, unspecified ligament, initial encounter  ? ? ?Rx / DC Orders ?ED Discharge Orders   ? ? None  ? ?  ? ? ?  ?Sherwood Gambler, MD ?10/22/21 1422 ? ?

## 2021-10-22 NOTE — ED Triage Notes (Signed)
Pt reports stepping off a curb and heard his left ankle pop. Painful to put weight on ankle, swelling noted. Pt has taken 2 tylenol.  ?

## 2021-10-26 ENCOUNTER — Ambulatory Visit (AMBULATORY_SURGERY_CENTER): Payer: BC Managed Care – PPO | Admitting: Gastroenterology

## 2021-10-26 ENCOUNTER — Encounter: Payer: Self-pay | Admitting: Gastroenterology

## 2021-10-26 VITALS — BP 125/72 | HR 63 | Temp 98.6°F | Resp 8 | Ht 75.0 in | Wt 293.0 lb

## 2021-10-26 DIAGNOSIS — K635 Polyp of colon: Secondary | ICD-10-CM

## 2021-10-26 DIAGNOSIS — Z1211 Encounter for screening for malignant neoplasm of colon: Secondary | ICD-10-CM | POA: Diagnosis present

## 2021-10-26 DIAGNOSIS — D124 Benign neoplasm of descending colon: Secondary | ICD-10-CM

## 2021-10-26 DIAGNOSIS — D125 Benign neoplasm of sigmoid colon: Secondary | ICD-10-CM

## 2021-10-26 MED ORDER — SODIUM CHLORIDE 0.9 % IV SOLN: 500.0000 mL | Freq: Once | INTRAVENOUS | Status: DC

## 2021-10-26 NOTE — Progress Notes (Signed)
CNW - VS ? ?Pt's states no medical or surgical changes since previsit or office visit. Except rolled left ankle. ? ?Pt reported he rolled his left ankle this past weekend and he is in a boot now.  Boot is under the stretcher. ?

## 2021-10-26 NOTE — Patient Instructions (Signed)
YOU HAD AN ENDOSCOPIC PROCEDURE TODAY AT [facility]:   Refer to the procedure report that was given to you for any specific questions about what was found during the examination.  If the procedure report does not answer your questions, please call your gastroenterologist to clarify.  If you requested that your care partner not be given the details of your procedure findings, then the procedure report has been included in a sealed envelope for you to review at your convenience later. ? ?**Handouts given on polyps and hemorrhoids** ? ?YOU SHOULD EXPECT: Some feelings of bloating in the abdomen. Passage of more gas than usual.  Walking can help get rid of the air that was put into your GI tract during the procedure and reduce the bloating. If you had a lower endoscopy (such as a colonoscopy or flexible sigmoidoscopy) you may notice spotting of blood in your stool or on the toilet paper. If you underwent a bowel prep for your procedure, you may not have a normal bowel movement for a few days. ? ?Please Note:  You might notice some irritation and congestion in your nose or some drainage.  This is from the oxygen used during your procedure.  There is no need for concern and it should clear up in a day or so. ? ?SYMPTOMS TO REPORT IMMEDIATELY: ? ?Following lower endoscopy (colonoscopy or flexible sigmoidoscopy): ? Excessive amounts of blood in the stool ? Significant tenderness or worsening of abdominal pains ? Swelling of the abdomen that is new, acute ? Fever of 100?F or higher ? ?For urgent or emergent issues, a gastroenterologist can be reached at any hour by calling [phone]. ?Do not use MyChart messaging for urgent concerns.  ? ? ?DIET:  We do recommend a small meal at first, but then you may proceed to your regular diet.  Drink plenty of fluids but you should avoid alcoholic beverages for 24 hours. ? ?ACTIVITY:  You should plan to take it easy for the rest of today and you should NOT DRIVE or  use heavy machinery until tomorrow (because of the sedation medicines used during the test).   ? ?FOLLOW UP: ?Our staff will call the number listed on your records 48-72 hours following your procedure to check on you and address any questions or concerns that you may have regarding the information given to you following your procedure. If we do not reach you, we will leave a message.  We will attempt to reach you two times.  During this call, we will ask if you have developed any symptoms of COVID 19. If you develop any symptoms (ie: fever, flu-like symptoms, shortness of breath, cough etc.) before then, please call [phone].  If you test positive for Covid 19 in the 2 weeks post procedure, please call and report this information to [place].   ? ?If any biopsies were taken you will be contacted by phone or by letter within the next 1-3 weeks.  Please call us at [phone] if you have not heard about the biopsies in 3 weeks.  ? ? ?SIGNATURES/CONFIDENTIALITY: ?You and/or your care partner have signed paperwork which will be entered into your electronic medical record.  These signatures attest to the fact that that the information above on your After Visit Summary has been reviewed and is understood.  Full responsibility of the confidentiality of this discharge information lies with you and/or your care-partner.  ?

## 2021-10-26 NOTE — Progress Notes (Signed)
Called to room to assist during endoscopic procedure.  Patient ID and intended procedure confirmed with present staff. Received instructions for my participation in the procedure from the performing physician.  

## 2021-10-26 NOTE — Progress Notes (Signed)
[facility] History and Physical ? ? ?Primary Care Physician:  Chevis Pretty, FNP ? ? ?Reason for Procedure:   Colorectal cancer screening ? ?Plan:     colonoscopy ? ? ? ? ?HPI: Peter Murray. is a 51 y.o. male  ? ? ?Past Medical History:  ?Diagnosis Date  ? Hyperlipidemia   ? Hypertension   ? Sleep apnea   ? ? ?Past Surgical History:  ?Procedure Laterality Date  ? LAPAROSCOPIC GASTRIC BANDING    ? ? ?Prior to Admission medications   ?Medication Sig Start Date End Date Taking? Authorizing Provider  ?atorvastatin (LIPITOR) 40 MG tablet Take 1 tablet (40 mg total) by mouth daily. 09/08/21  Yes Chevis Pretty, FNP  ?hydrochlorothiazide (HYDRODIURIL) 25 MG tablet Take 1 tablet (25 mg total) by mouth daily. 09/08/21  Yes Hassell Done, Mary-Margaret, FNP  ?ibuprofen (ADVIL) 200 MG tablet Take 200 mg by mouth every 6 (six) hours as needed.   Yes [provider]  ?Multiple Vitamin (MULTI-VITAMIN) tablet Take 1 tablet by mouth daily.   Yes [provider]  ?fluorouracil (EFUDEX) 5 % cream fluorouracil 5 % topical cream    [provider]  ? ? ?Current Outpatient Medications  ?Medication Sig Dispense Refill  ? atorvastatin (LIPITOR) 40 MG tablet Take 1 tablet (40 mg total) by mouth daily. 90 tablet 1  ? hydrochlorothiazide (HYDRODIURIL) 25 MG tablet Take 1 tablet (25 mg total) by mouth daily. 90 tablet 1  ? ibuprofen (ADVIL) 200 MG tablet Take 200 mg by mouth every 6 (six) hours as needed.    ? Multiple Vitamin (MULTI-VITAMIN) tablet Take 1 tablet by mouth daily.    ? fluorouracil (EFUDEX) 5 % cream fluorouracil 5 % topical cream    ? ?Current Facility-Administered Medications  ?Medication Dose Route Frequency Provider Last Rate Last Admin  ? 0.9 %  sodium chloride infusion  500 mL Intravenous Once Jackquline Denmark, MD      ? ? ?Allergies as of 10/26/2021  ? (No Known Allergies)  ? ? ?Family History  ?Problem Relation Age of Onset  ? Heart disease Mother   ? Hyperlipidemia  Mother   ? Hypertension Mother   ? Diabetes Mother   ? Heart disease Father   ? Hyperlipidemia Father   ? Hypertension Father   ? Stroke Father   ? Colon polyps Neg Hx   ? Colon cancer Neg Hx   ? Esophageal cancer Neg Hx   ? Stomach cancer Neg Hx   ? Rectal cancer Neg Hx   ? ? ?Social History  ? ?Socioeconomic History  ? Marital status: Married  ?  Spouse name: Not on file  ? Number of children: Not on file  ? Years of education: Not on file  ? Highest education level: Not on file  ?Occupational History  ? Not on file  ?Tobacco Use  ? Smoking status: Every Day  ?  Packs/day: 1.00  ?  Years: 15.00  ?  Pack years: 15.00  ?  Types: Cigarettes  ?  Last attempt to quit: 10/19/2009  ?  Years since quitting: 12.0  ? Smokeless tobacco: Never  ?Vaping Use  ? Vaping Use: Every day  ?Substance and Sexual Activity  ? Alcohol use: No  ?  Comment: once or twice daily  ? Drug use: No  ? Sexual activity: Yes  ?Other Topics Concern  ? Not on file  ?Social History Narrative  ? Not on file  ? ?Social Determinants of Health  ? ?  Financial Resource Strain: Not on file  ?Food Insecurity: Not on file  ?Transportation Needs: Not on file  ?Physical Activity: Not on file  ?Stress: Not on file  ?Social Connections: Not on file  ?Intimate Partner Violence: Not on file  ? ? ?Review of Systems: ?Positive for  none ?All other review of systems negative except as mentioned in the HPI. ? ?Physical Exam: ?Vital signs in last 24 hours: ?'@VSRANGES'$ @ ?  ?General:   Alert,  Well-developed, well-nourished, pleasant and cooperative in NAD ?Lungs:  Clear throughout to auscultation.   ?Heart:  Regular rate and rhythm; no murmurs, clicks, rubs,  or gallops. ?Abdomen:  Soft, nontender and nondistended. Normal bowel sounds.   ?Neuro/Psych:  Alert and cooperative. Normal mood and affect. A and O x 3 ? ? ? ?No significant changes were identified.  The patient continues to be an appropriate candidate for the planned procedure and anesthesia. ? ? ?Carmell Austria,  MD. ?[facility] ?10/26/2021 8:08 AM@ ? ?

## 2021-10-26 NOTE — Progress Notes (Signed)
PT taken to PACU. Monitors in place. VSS. Report given to RN. 

## 2021-10-26 NOTE — Op Note (Signed)
[place] ?Patient Name: Peter Murray ?Procedure Date: 10/26/2021 8:04 AM ?MRN: [id number] ?Endoscopist: Jackquline Denmark , MD ?Age: 51 ?Referring MD:  ?Date of Birth: [birth year] ?Gender: Male ?Account #: [id number] ?Procedure:                Colonoscopy ?Indications:              Screening for colorectal malignant neoplasm ?Medicines:                Monitored Anesthesia Care ?Procedure:                Pre-Anesthesia Assessment: ?                          - Prior to the procedure, a History and Physical  ?                          was performed, and patient medications and  ?                          allergies were reviewed. The patient's tolerance of  ?                          previous anesthesia was also reviewed. The risks  ?                          and benefits of the procedure and the sedation  ?                          options and risks were discussed with the patient.  ?                          All questions were answered, and informed consent  ?                          was obtained. Prior Anticoagulants: The patient has  ?                          taken no previous anticoagulant or antiplatelet  ?                          agents. ASA Grade Assessment: II - A patient with  ?                          mild systemic disease. After reviewing the risks  ?                          and benefits, the patient was deemed in  ?                          satisfactory condition to undergo the procedure. ?                          After obtaining informed consent, the colonoscope  ?  was passed under direct vision. Throughout the  ?                          procedure, the patient's blood pressure, pulse, and  ?                          oxygen saturations were monitored continuously. The  ?                          Colonoscope was introduced through the anus and  ?                          advanced to the 2 cm into the ileum. The  ?                          colonoscopy was performed without  difficulty. The  ?                          patient tolerated the procedure well. The quality  ?                          of the bowel preparation was good. The terminal  ?                          ileum, ileocecal valve, appendiceal orifice, and  ?                          rectum were photographed. ?Scope In: 8:11:49 AM ?Scope Out: 8:35:26 AM ?Scope Withdrawal Time: 0 hours 17 minutes 34 seconds  ?Total Procedure Duration: 0 hours 23 minutes 37 seconds  ?Findings:                 A 20 mm polyp was found in the proximal sigmoid  ?                          colon. The polyp was pedunculated. The polyp was  ?                          removed with a hot snare. Resection and retrieval  ?                          were complete. Area was tattooed with an injection  ?                          of 1 mL of Spot (carbon black) at the opposite wall. ?                          A 2 mm polyp was found in the mid descending colon.  ?                          The polyp was sessile. The polyp was removed with a  ?  cold snare. Resection and retrieval were complete. ?                          Non-bleeding internal hemorrhoids were found during  ?                          retroflexion. The hemorrhoids were small and Grade  ?                          I (internal hemorrhoids that do not prolapse). ?                          The terminal ileum appeared normal. ?                          The exam was otherwise without abnormality on  ?                          direct and retroflexion views. ?Complications:            No immediate complications. ?Estimated Blood Loss:     Estimated blood loss: none. ?Impression:               - One 20 mm polyp in the proximal sigmoid colon,  ?                          removed with a hot snare. Resected and retrieved.  ?                          Tattooed. ?                          - One 2 mm polyp in the mid descending colon,  ?                          removed with a cold snare.  Resected and retrieved. ?                          - Non-bleeding internal hemorrhoids. ?                          - The examined portion of the ileum was normal. ?                          - The examination was otherwise normal on direct  ?                          and retroflexion views. ?Recommendation:           - Patient has a contact number available for  ?                          emergencies. The signs and symptoms of potential  ?                          delayed complications were  discussed with the  ?                          patient. Return to normal activities tomorrow.  ?                          Written discharge instructions were provided to the  ?                          patient. ?                          - Resume previous diet. ?                          - Continue present medications. ?                          - No aspirin, ibuprofen, naproxen, or other  ?                          non-steroidal anti-inflammatory drugs for 5 days  ?                          after polyp removal. ?                          - Await pathology results. ?                          - Repeat colonoscopy for surveillance based on  ?                          pathology results. ?                          - The findings and recommendations were discussed  ?                          with the patient's family. ?Jackquline Denmark, MD ?10/26/2021 8:40:36 AM ?This report has been signed electronically. ?

## 2021-10-28 ENCOUNTER — Telehealth: Payer: Self-pay | Admitting: *Deleted

## 2021-10-28 NOTE — Telephone Encounter (Signed)
?  Follow up Call- ? ?Call back number 10/26/2021  ?Post procedure Call Back phone  # [phone] cell  ?Some recent data might be hidden  ?  ? ?Patient questions: ? ?Do you have a fever, pain , or abdominal swelling? No. ?Pain Score  0 * ? ?Have you tolerated food without any problems? Yes.   ? ?Have you been able to return to your normal activities? Yes.   ? ?Do you have any questions about your discharge instructions: ?Diet   No. ?Medications  No. ?Follow up visit  No. ? ?Do you have questions or concerns about your Care? Yes.   ? ?Actions: ?* If pain score is 4 or above: ?No action needed, pain <4. ? ? ?

## 2021-11-13 ENCOUNTER — Encounter: Payer: Self-pay | Admitting: Gastroenterology

## 2022-03-09 ENCOUNTER — Ambulatory Visit: Payer: BC Managed Care – PPO | Admitting: Nurse Practitioner

## 2022-03-14 ENCOUNTER — Ambulatory Visit: Payer: BC Managed Care – PPO | Admitting: Nurse Practitioner

## 2022-03-14 ENCOUNTER — Encounter: Payer: Self-pay | Admitting: Nurse Practitioner

## 2022-03-14 VITALS — BP 110/71 | HR 65 | Temp 98.3°F | Resp 20 | Ht 75.0 in | Wt 297.0 lb

## 2022-03-14 DIAGNOSIS — I10 Essential (primary) hypertension: Secondary | ICD-10-CM

## 2022-03-14 DIAGNOSIS — F3342 Major depressive disorder, recurrent, in full remission: Secondary | ICD-10-CM

## 2022-03-14 DIAGNOSIS — E7800 Pure hypercholesterolemia, unspecified: Secondary | ICD-10-CM

## 2022-03-14 DIAGNOSIS — Z6838 Body mass index (BMI) 38.0-38.9, adult: Secondary | ICD-10-CM

## 2022-03-14 MED ORDER — HYDROCHLOROTHIAZIDE 25 MG PO TABS: 25.0000 mg | ORAL_TABLET | Freq: Every day | ORAL | 1 refills | Status: DC

## 2022-03-14 MED ORDER — ATORVASTATIN CALCIUM 40 MG PO TABS: 40.0000 mg | ORAL_TABLET | Freq: Every day | ORAL | 1 refills | Status: DC

## 2022-03-14 NOTE — Progress Notes (Signed)
Subjective:    Patient ID: Peter Noble., male    DOB: 1971-02-28, 51 y.o.   MRN: [id number]   Chief Complaint: Medical Management of Chronic Issues    HPI:  Peter Gleed. is a 51 y.o. who identifies as a male who was assigned male at birth.   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 Primary hypertension No c/o chest pain, sob ir headache. Does not check blood pressure at home. BP Readings from Last 3 Encounters:  03/14/22 110/71  10/26/21 125/72  10/22/21 132/82     2. Pure hypercholesterolemia Doe t ry to watch diet. Does some exercise. Lab Results  Component Value Date   CHOL 138 09/08/2021   HDL 40 09/08/2021   LDLCALC 85 09/08/2021   TRIG 63 09/08/2021   CHOLHDL 3.5 09/08/2021   The 10-year ASCVD risk score (Arnett DK, et al., 2019) is: 5.2%   3. Recurrent major depressive disorder, in full remission ([place]) Is no longer on antidepressant.     03/14/2022    9:37 AM 09/08/2021    8:07 AM 03/07/2021    8:09 AM  Depression screen PHQ 2/9  Decreased Interest 1 1 0  Down, Depressed, Hopeless 1 0 0  PHQ - 2 Score 2 1 0  Altered sleeping 0 0 0  Tired, decreased energy 1 0 1  Change in appetite 1 0 1  Feeling bad or failure about yourself  1 0 0  Trouble concentrating 0 0 0  Moving slowly or fidgety/restless 0 0 0  Suicidal thoughts 0 0 0  PHQ-9 Score _0 Difficult doing work/chores Somewhat difficult Not difficult at all Not difficult at all     4. BMI 38.0-38.9,adult Wt Readings from Last 3 Encounters:  03/14/22 297 lb (134.7 kg)  10/26/21 293 lb (132.9 kg)  10/22/21 295 lb (133.8 kg)   BMI Readings from Last 3 Encounters:  03/14/22 37.12 kg/m  10/26/21 36.62 kg/m  10/22/21 36.87 kg/m      New complaints: None today  No Known Allergies Outpatient Encounter Medications as of 03/14/2022   Medication Sig   atorvastatin (LIPITOR) 40 MG tablet Take 1 tablet (40 mg total) by mouth daily.   fluorouracil (EFUDEX) 5 % cream fluorouracil 5 % topical cream   hydrochlorothiazide (HYDRODIURIL) 25 MG tablet Take 1 tablet (25 mg total) by mouth daily.   ibuprofen (ADVIL) 200 MG tablet Take 200 mg by mouth every 6 (six) hours as needed.   Multiple Vitamin (MULTI-VITAMIN) tablet Take 1 tablet by mouth daily.   No facility-administered encounter medications on file as of 03/14/2022.    Past Surgical History:  Procedure Laterality Date   LAPAROSCOPIC GASTRIC BANDING      Family History  Problem Relation Age of Onset   Heart disease Mother    Hyperlipidemia Mother    Hypertension Mother    Diabetes Mother    Heart disease Father    Hyperlipidemia Father    Hypertension Father    Stroke Father    Colon polyps Neg Hx    Colon cancer Neg Hx    Esophageal cancer Neg Hx    Stomach cancer Neg Hx    Rectal cancer Neg Hx       Controlled substance contract: n/a     Review of Systems  Constitutional:  Negative  for diaphoresis.  Eyes:  Negative for pain.  Respiratory:  Negative for shortness of breath.   Cardiovascular:  Negative for chest pain, palpitations and leg swelling.  Gastrointestinal:  Negative for abdominal pain.  Endocrine: Negative for polydipsia.  Skin:  Negative for rash.  Neurological:  Negative for dizziness, weakness and headaches.  Hematological:  Does not bruise/bleed easily.  All other systems reviewed and are negative.      Objective:   Physical Exam Vitals and nursing note reviewed.  Constitutional:      Appearance: Normal appearance. He is well-developed.  HENT:     Head: Normocephalic.     Nose: Nose normal.     Mouth/Throat:     Mouth: Mucous membranes are moist.     Pharynx: Oropharynx is clear.  Eyes:     Pupils: Pupils are equal, round, and reactive to light.  Neck:     Thyroid: No thyroid mass or thyromegaly.     Vascular: No  carotid bruit or JVD.     Trachea: Phonation normal.  Cardiovascular:     Rate and Rhythm: Normal rate and regular rhythm.  Pulmonary:     Effort: Pulmonary effort is normal. No respiratory distress.     Breath sounds: Normal breath sounds.  Abdominal:     General: Bowel sounds are normal.     Palpations: Abdomen is soft.     Tenderness: There is no abdominal tenderness.  Musculoskeletal:        General: Normal range of motion.     Cervical back: Normal range of motion and neck supple.  Lymphadenopathy:     Cervical: No cervical adenopathy.  Skin:    General: Skin is warm and dry.  Neurological:     Mental Status: He is alert and oriented to person, place, and time.  Psychiatric:        Behavior: Behavior normal.        Thought Content: Thought content normal.        Judgment: Judgment normal.    BP 110/71   Pulse 65   Temp 98.3 F (36.8 C) (Temporal)   Resp 20   Ht 6' 3" (1.905 m)   Wt 297 lb (134.7 kg)   SpO2 97%   BMI 37.12 kg/m         Assessment & Plan:   Peter Noble. comes in today with chief complaint of Medical Management of Chronic Issues   Diagnosis and orders addressed:  1. Primary hypertension Low sodium diet - hydrochlorothiazide (HYDRODIURIL) 25 MG tablet; Take 1 tablet (25 mg total) by mouth daily.  Dispense: 90 tablet; Refill: 1 - CBC with Differential/Platelet - CMP14+EGFR  2. Pure hypercholesterolemia Low fat diet - atorvastatin (LIPITOR) 40 MG tablet; Take 1 tablet (40 mg total) by mouth daily.  Dispense: 90 tablet; Refill: 1 - Lipid panel  3. Recurrent major depressive disorder, in full remission ([place]) Stress management  4. BMI 38.0-38.9,adult Discussed diet and exercise for person with BMI >25 Will recheck weight in 3-6 months    Labs pending Health Maintenance reviewed Diet and exercise encouraged  Follow up plan: 6 months   Mary-Margaret Hassell Done, FNP

## 2022-03-14 NOTE — Patient Instructions (Signed)
Exercising to Stay Healthy To become healthy and stay healthy, it is recommended that you do moderate-intensity and vigorous-intensity exercise. You can tell that you are exercising at a moderate intensity if your heart starts beating faster and you start breathing faster but can still hold a conversation. You can tell that you are exercising at a vigorous intensity if you are breathing much harder and faster and cannot hold a conversation while exercising. How can exercise benefit me? Exercising regularly is important. It has many health benefits, such as: Improving overall fitness, flexibility, and endurance. Increasing bone density. Helping with weight control. Decreasing body fat. Increasing muscle strength and endurance. Reducing stress and tension, anxiety, depression, or anger. Improving overall health. What guidelines should I follow while exercising? Before you start a new exercise program, talk with your health care provider. Do not exercise so much that you hurt yourself, feel dizzy, or get very short of breath. Wear comfortable clothes and wear shoes with good support. Drink plenty of water while you exercise to prevent dehydration or heat stroke. Work out until your breathing and your heartbeat get faster (moderate intensity). How often should I exercise? Choose an activity that you enjoy, and set realistic goals. Your health care provider can help you make an activity plan that is individually designed and works best for you. Exercise regularly as told by your health care provider. This may include: Doing strength training two times a week, such as: Lifting weights. Using resistance bands. Push-ups. Sit-ups. Yoga. Doing a certain intensity of exercise for a given amount of time. Choose from these options: A total of 150 minutes of moderate-intensity exercise every week. A total of 75 minutes of vigorous-intensity exercise every week. A mix of moderate-intensity and  vigorous-intensity exercise every week. Children, pregnant women, people who have not exercised regularly, people who are overweight, and older adults may need to talk with a health care provider about what activities are safe to perform. If you have a medical condition, be sure to talk with your health care provider before you start a new exercise program. What are some exercise ideas? Moderate-intensity exercise ideas include: Walking 1 mile (1.6 km) in about 15 minutes. Biking. Hiking. Golfing. Dancing. Water aerobics. Vigorous-intensity exercise ideas include: Walking 4.5 miles (7.2 km) or more in about 1 hour. Jogging or running 5 miles (8 km) in about 1 hour. Biking 10 miles (16.1 km) or more in about 1 hour. Lap swimming. Roller-skating or in-line skating. Cross-country skiing. Vigorous competitive sports, such as football, basketball, and soccer. Jumping rope. Aerobic dancing. What are some everyday activities that can help me get exercise? Yard work, such as: Pushing a lawn mower. Raking and bagging leaves. Washing your car. Pushing a stroller. Shoveling snow. Gardening. Washing windows or floors. How can I be more active in my day-to-day activities? Use stairs instead of an elevator. Take a walk during your lunch break. If you drive, park your car farther away from your work or school. If you take public transportation, get off one stop early and walk the rest of the way. Stand up or walk around during all of your indoor phone calls. Get up, stretch, and walk around every 30 minutes throughout the day. Enjoy exercise with a friend. Support to continue exercising will help you keep a regular routine of activity. Where to find more information You can find more information about exercising to stay healthy from: U.S. Department of Health and Human Services: www.hhs.gov Centers for Disease Control and Prevention (  CDC): www.cdc.gov Summary Exercising regularly is  important. It will improve your overall fitness, flexibility, and endurance. Regular exercise will also improve your overall health. It can help you control your weight, reduce stress, and improve your bone density. Do not exercise so much that you hurt yourself, feel dizzy, or get very short of breath. Before you start a new exercise program, talk with your health care provider. This information is not intended to replace advice given to you by your health care provider. Make sure you discuss any questions you have with your health care provider. Document Revised: 12/03/2020 Document Reviewed: 12/03/2020 Elsevier Patient Education  2023 Elsevier Inc.  

## 2022-03-15 LAB — CMP14+EGFR
ALT: 24 IU/L (ref 0–44)
AST: 21 IU/L (ref 0–40)
Albumin/Globulin Ratio: 1.7 (ref 1.2–2.2)
Albumin: 4.7 g/dL (ref 4.1–5.1)
Alkaline Phosphatase: 69 IU/L (ref 44–121)
BUN/Creatinine Ratio: 8 — ABNORMAL LOW (ref 9–20)
BUN: 9 mg/dL (ref 6–24)
Bilirubin Total: 0.6 mg/dL (ref 0.0–1.2)
CO2: 26 mmol/L (ref 20–29)
Calcium: 10 mg/dL (ref 8.7–10.2)
Chloride: 98 mmol/L (ref 96–106)
Creatinine, Ser: 1.14 mg/dL (ref 0.76–1.27)
Globulin, Total: 2.7 g/dL (ref 1.5–4.5)
Glucose: 121 mg/dL — ABNORMAL HIGH (ref 70–99)
Potassium: 3.7 mmol/L (ref 3.5–5.2)
Sodium: 140 mmol/L (ref 134–144)
Total Protein: 7.4 g/dL (ref 6.0–8.5)
eGFR: 78 mL/min/{1.73_m2} (ref >59)

## 2022-03-15 LAB — CBC WITH DIFFERENTIAL/PLATELET
Basophils Absolute: 0.1 10*3/uL (ref 0.0–0.2)
Basos: 1 %
EOS (ABSOLUTE): 0.3 10*3/uL (ref 0.0–0.4)
Eos: 4 %
Hematocrit: 50.1 % (ref 37.5–51.0)
Hemoglobin: 17.2 g/dL (ref 13.0–17.7)
Immature Grans (Abs): 0 10*3/uL (ref 0.0–0.1)
Immature Granulocytes: 0 %
Lymphocytes Absolute: 2.7 10*3/uL (ref 0.7–3.1)
Lymphs: 37 %
MCH: 30.6 pg (ref 26.6–33.0)
MCHC: 34.3 g/dL (ref 31.5–35.7)
MCV: 89 fL (ref 79–97)
Monocytes Absolute: 0.6 10*3/uL (ref 0.1–0.9)
Monocytes: 8 %
Neutrophils Absolute: 3.5 10*3/uL (ref 1.4–7.0)
Neutrophils: 50 %
Platelets: 250 10*3/uL (ref 150–450)
RBC: 5.62 x10E6/uL (ref 4.14–5.80)
RDW: 13.1 % (ref 11.6–15.4)
WBC: 7.1 10*3/uL (ref 3.4–10.8)

## 2022-03-15 LAB — LIPID PANEL
Chol/HDL Ratio: 3.7 ratio (ref 0.0–5.0)
Cholesterol, Total: 146 mg/dL (ref 100–199)
HDL: 39 mg/dL — ABNORMAL LOW (ref >39)
LDL Chol Calc (NIH): 87 mg/dL (ref 0–99)
Triglycerides: 107 mg/dL (ref 0–149)
VLDL Cholesterol Cal: 20 mg/dL (ref 5–40)

## 2022-09-14 ENCOUNTER — Ambulatory Visit: Payer: BC Managed Care – PPO | Admitting: Nurse Practitioner

## 2022-09-14 ENCOUNTER — Encounter: Payer: Self-pay | Admitting: Nurse Practitioner

## 2022-09-14 VITALS — BP 114/73 | HR 71 | Temp 98.1°F | Resp 20 | Ht 75.0 in | Wt 290.0 lb

## 2022-09-14 DIAGNOSIS — Z6838 Body mass index (BMI) 38.0-38.9, adult: Secondary | ICD-10-CM | POA: Diagnosis not present

## 2022-09-14 DIAGNOSIS — I10 Essential (primary) hypertension: Secondary | ICD-10-CM | POA: Diagnosis not present

## 2022-09-14 DIAGNOSIS — E7800 Pure hypercholesterolemia, unspecified: Secondary | ICD-10-CM

## 2022-09-14 DIAGNOSIS — F3342 Major depressive disorder, recurrent, in full remission: Secondary | ICD-10-CM | POA: Diagnosis not present

## 2022-09-14 LAB — CBC WITH DIFFERENTIAL/PLATELET

## 2022-09-14 MED ORDER — HYDROCHLOROTHIAZIDE 25 MG PO TABS: 25.0000 mg | ORAL_TABLET | Freq: Every day | ORAL | 1 refills | Status: DC

## 2022-09-14 MED ORDER — ATORVASTATIN CALCIUM 40 MG PO TABS: 40.0000 mg | ORAL_TABLET | Freq: Every day | ORAL | 1 refills | Status: DC

## 2022-09-14 NOTE — Progress Notes (Signed)
Subjective:    Patient ID: Peter Murray., male    DOB: 11/04/1970, 52 y.o.   MRN: [id number]   Chief Complaint: medical management of chronic issues    HPI:  Peter Stickels. is a 52 y.o. who identifies as a male who was assigned male at birth.   Social history: Lives with: wife Work history: Librarian, academic   Comes in today for follow up of the following chronic medical issues:  1. Primary hypertension No c/o chest pain, sob or headache. Does not check blood pressure at home. BP Readings from Last 3 Encounters:  09/14/22 114/73  03/14/22 110/71  10/26/21 125/72     2. Pure hypercholesterolemia Doe snot really watch diet. No dedicated exercise. Lab Results  Component Value Date   CHOL 146 03/14/2022   HDL 39 (L) 03/14/2022   LDLCALC 87 03/14/2022   TRIG 107 03/14/2022   CHOLHDL 3.7 03/14/2022     3. Recurrent major depressive disorder, in full remission ([place]) On no current meds right now    09/14/2022   10:00 AM 03/14/2022    9:37 AM 09/08/2021    8:07 AM  Depression screen PHQ 2/9  Decreased Interest '1 1 1  '$ Down, Depressed, Hopeless 1 1 0  PHQ - 2 Score '2 2 1  '$ Altered sleeping 0 0 0  Tired, decreased energy 1 1 0  Change in appetite 1 1 0  Feeling bad or failure about yourself  0 1 0  Trouble concentrating 0 0 0  Moving slowly or fidgety/restless 0 0 0  Suicidal thoughts 0 0 0  PHQ-9 Score '4 5 1  '$ Difficult doing work/chores Not difficult at all Somewhat difficult Not difficult at all     4. BMI 38.0-38.9,adult No recent weight changes Wt Readings from Last 3 Encounters:  09/14/22 290 lb (131.5 kg)  03/14/22 297 lb (134.7 kg)  10/26/21 293 lb (132.9 kg)   BMI Readings from Last 3 Encounters:  09/14/22 36.25 kg/m  03/14/22 37.12 kg/m  10/26/21 36.62 kg/m      New complaints: None today  No Known Allergies Outpatient Encounter Medications as of 09/14/2022  Medication Sig   atorvastatin (LIPITOR) 40 MG tablet Take 1 tablet (40 mg  total) by mouth daily.   fluorouracil (EFUDEX) 5 % cream fluorouracil 5 % topical cream   hydrochlorothiazide (HYDRODIURIL) 25 MG tablet Take 1 tablet (25 mg total) by mouth daily.   ibuprofen (ADVIL) 200 MG tablet Take 200 mg by mouth every 6 (six) hours as needed.   Multiple Vitamin (MULTI-VITAMIN) tablet Take 1 tablet by mouth daily.   No facility-administered encounter medications on file as of 09/14/2022.    Past Surgical History:  Procedure Laterality Date   LAPAROSCOPIC GASTRIC BANDING      Family History  Problem Relation Age of Onset   Heart disease Mother    Hyperlipidemia Mother    Hypertension Mother    Diabetes Mother    Heart disease Father    Hyperlipidemia Father    Hypertension Father    Stroke Father    Colon polyps Neg Hx    Colon cancer Neg Hx    Esophageal cancer Neg Hx    Stomach cancer Neg Hx    Rectal cancer Neg Hx       Controlled substance contract: n/a     Review of Systems  Constitutional:  Negative for diaphoresis.  Eyes:  Negative for pain.  Respiratory:  Negative for shortness of breath.  Cardiovascular:  Negative for chest pain, palpitations and leg swelling.  Gastrointestinal:  Negative for abdominal pain.  Endocrine: Negative for polydipsia.  Skin:  Negative for rash.  Neurological:  Negative for dizziness, weakness and headaches.  Hematological:  Does not bruise/bleed easily.  All other systems reviewed and are negative.      Objective:   Physical Exam Vitals and nursing note reviewed.  Constitutional:      Appearance: Normal appearance. He is well-developed.  HENT:     Head: Normocephalic.     Nose: Nose normal.     Mouth/Throat:     Mouth: Mucous membranes are moist.     Pharynx: Oropharynx is clear.  Eyes:     Pupils: Pupils are equal, round, and reactive to light.  Neck:     Thyroid: No thyroid mass or thyromegaly.     Vascular: No carotid bruit or JVD.     Trachea: Phonation normal.  Cardiovascular:     Rate  and Rhythm: Normal rate and regular rhythm.  Pulmonary:     Effort: Pulmonary effort is normal. No respiratory distress.     Breath sounds: Normal breath sounds.  Abdominal:     General: Bowel sounds are normal.     Palpations: Abdomen is soft.     Tenderness: There is no abdominal tenderness.  Musculoskeletal:        General: Normal range of motion.     Cervical back: Normal range of motion and neck supple.  Lymphadenopathy:     Cervical: No cervical adenopathy.  Skin:    General: Skin is warm and dry.  Neurological:     Mental Status: He is alert and oriented to person, place, and time.  Psychiatric:        Behavior: Behavior normal.        Thought Content: Thought content normal.        Judgment: Judgment normal.    BP 114/73   Pulse 71   Temp 98.1 F (36.7 C) (Temporal)   Resp 20   Ht '6\' 3"'$  (1.905 m)   Wt 290 lb (131.5 kg)   SpO2 96%   BMI 36.25 kg/m         Assessment & Plan:  Peter Murray. comes in today with chief complaint of Medical Management of Chronic Issues   Diagnosis and orders addressed:  1. Primary hypertension Low sodium diet - hydrochlorothiazide (HYDRODIURIL) 25 MG tablet; Take 1 tablet (25 mg total) by mouth daily.  Dispense: 90 tablet; Refill: 1 - CBC with Differential/Platelet - CMP14+EGFR  2. Pure hypercholesterolemia Low fat diet - atorvastatin (LIPITOR) 40 MG tablet; Take 1 tablet (40 mg total) by mouth daily.  Dispense: 90 tablet; Refill: 1 - Lipid panel  3. Recurrent major depressive disorder, in full remission ([place]) Stress management  4. BMI 38.0-38.9,adult Discussed diet and exercise for person with BMI >25 Will recheck weight in 3-6 months    Labs pending Health Maintenance reviewed Diet and exercise encouraged  Follow up plan: 6 months   Mary-Margaret Hassell Done, FNP

## 2022-09-14 NOTE — Patient Instructions (Signed)

## 2022-09-15 LAB — CBC WITH DIFFERENTIAL/PLATELET
Basophils Absolute: 0.1 10*3/uL (ref 0.0–0.2)
Basos: 1 %
EOS (ABSOLUTE): 0.3 10*3/uL (ref 0.0–0.4)
Eos: 4 %
Hematocrit: 53.1 % — ABNORMAL HIGH (ref 37.5–51.0)
Hemoglobin: 17.8 g/dL — ABNORMAL HIGH (ref 13.0–17.7)
Immature Grans (Abs): 0 10*3/uL (ref 0.0–0.1)
Immature Granulocytes: 0 %
Lymphocytes Absolute: 2.5 10*3/uL (ref 0.7–3.1)
Lymphs: 37 %
MCH: 29.6 pg (ref 26.6–33.0)
MCHC: 33.5 g/dL (ref 31.5–35.7)
MCV: 88 fL (ref 79–97)
Monocytes Absolute: 0.6 10*3/uL (ref 0.1–0.9)
Monocytes: 9 %
Neutrophils Absolute: 3.3 10*3/uL (ref 1.4–7.0)
Neutrophils: 49 %
Platelets: 260 10*3/uL (ref 150–450)
RBC: 6.02 x10E6/uL — ABNORMAL HIGH (ref 4.14–5.80)
RDW: 12.5 % (ref 11.6–15.4)
WBC: 6.6 10*3/uL (ref 3.4–10.8)

## 2022-09-15 LAB — CMP14+EGFR
ALT: 24 IU/L (ref 0–44)
AST: 22 IU/L (ref 0–40)
Albumin/Globulin Ratio: 1.9 (ref 1.2–2.2)
Albumin: 4.7 g/dL (ref 3.8–4.9)
Alkaline Phosphatase: 69 IU/L (ref 44–121)
BUN/Creatinine Ratio: 7 — ABNORMAL LOW (ref 9–20)
BUN: 8 mg/dL (ref 6–24)
Bilirubin Total: 0.7 mg/dL (ref 0.0–1.2)
CO2: 27 mmol/L (ref 20–29)
Calcium: 10 mg/dL (ref 8.7–10.2)
Chloride: 97 mmol/L (ref 96–106)
Creatinine, Ser: 1.15 mg/dL (ref 0.76–1.27)
Globulin, Total: 2.5 g/dL (ref 1.5–4.5)
Glucose: 116 mg/dL — ABNORMAL HIGH (ref 70–99)
Potassium: 4.1 mmol/L (ref 3.5–5.2)
Sodium: 141 mmol/L (ref 134–144)
Total Protein: 7.2 g/dL (ref 6.0–8.5)
eGFR: 77 mL/min/{1.73_m2} (ref >59)

## 2022-09-15 LAB — LIPID PANEL
Chol/HDL Ratio: 3.3 ratio (ref 0.0–5.0)
Cholesterol, Total: 130 mg/dL (ref 100–199)
HDL: 40 mg/dL (ref >39)
LDL Chol Calc (NIH): 75 mg/dL (ref 0–99)
Triglycerides: 76 mg/dL (ref 0–149)
VLDL Cholesterol Cal: 15 mg/dL (ref 5–40)

## 2023-03-15 ENCOUNTER — Encounter: Payer: Self-pay | Admitting: Nurse Practitioner

## 2023-03-15 ENCOUNTER — Ambulatory Visit: Payer: BC Managed Care – PPO | Admitting: Nurse Practitioner

## 2023-03-15 VITALS — BP 118/80 | HR 60 | Temp 97.8°F | Ht 75.0 in | Wt 286.0 lb

## 2023-03-15 DIAGNOSIS — I10 Essential (primary) hypertension: Secondary | ICD-10-CM

## 2023-03-15 DIAGNOSIS — E7800 Pure hypercholesterolemia, unspecified: Secondary | ICD-10-CM

## 2023-03-15 DIAGNOSIS — F3342 Major depressive disorder, recurrent, in full remission: Secondary | ICD-10-CM

## 2023-03-15 DIAGNOSIS — Z6838 Body mass index (BMI) 38.0-38.9, adult: Secondary | ICD-10-CM

## 2023-03-15 LAB — CMP14+EGFR
ALT: 18 IU/L (ref 0–44)
AST: 16 IU/L (ref 0–40)
Albumin: 4.5 g/dL (ref 3.8–4.9)
Alkaline Phosphatase: 61 IU/L (ref 44–121)
BUN/Creatinine Ratio: 10 (ref 9–20)
BUN: 11 mg/dL (ref 6–24)
Bilirubin Total: 0.6 mg/dL (ref 0.0–1.2)
CO2: 25 mmol/L (ref 20–29)
Calcium: 9.8 mg/dL (ref 8.7–10.2)
Chloride: 101 mmol/L (ref 96–106)
Creatinine, Ser: 1.14 mg/dL (ref 0.76–1.27)
Globulin, Total: 2.5 g/dL (ref 1.5–4.5)
Glucose: 118 mg/dL — ABNORMAL HIGH (ref 70–99)
Potassium: 4 mmol/L (ref 3.5–5.2)
Sodium: 139 mmol/L (ref 134–144)
Total Protein: 7 g/dL (ref 6.0–8.5)
eGFR: 78 mL/min/{1.73_m2} (ref >59)

## 2023-03-15 LAB — CBC WITH DIFFERENTIAL/PLATELET
Basophils Absolute: 0.1 10*3/uL (ref 0.0–0.2)
Basos: 1 %
EOS (ABSOLUTE): 0.2 10*3/uL (ref 0.0–0.4)
Eos: 3 %
Hematocrit: 50.4 % (ref 37.5–51.0)
Hemoglobin: 16.6 g/dL (ref 13.0–17.7)
Immature Grans (Abs): 0 10*3/uL (ref 0.0–0.1)
Immature Granulocytes: 0 %
Lymphocytes Absolute: 2.2 10*3/uL (ref 0.7–3.1)
Lymphs: 32 %
MCH: 29 pg (ref 26.6–33.0)
MCHC: 32.9 g/dL (ref 31.5–35.7)
MCV: 88 fL (ref 79–97)
Monocytes Absolute: 0.6 10*3/uL (ref 0.1–0.9)
Monocytes: 9 %
Neutrophils Absolute: 3.9 10*3/uL (ref 1.4–7.0)
Neutrophils: 55 %
Platelets: 245 10*3/uL (ref 150–450)
RBC: 5.72 x10E6/uL (ref 4.14–5.80)
RDW: 13.1 % (ref 11.6–15.4)
WBC: 6.9 10*3/uL (ref 3.4–10.8)

## 2023-03-15 LAB — LIPID PANEL
Chol/HDL Ratio: 2.8 ratio (ref 0.0–5.0)
Cholesterol, Total: 119 mg/dL (ref 100–199)
HDL: 42 mg/dL (ref >39)
LDL Chol Calc (NIH): 64 mg/dL (ref 0–99)
Triglycerides: 58 mg/dL (ref 0–149)
VLDL Cholesterol Cal: 13 mg/dL (ref 5–40)

## 2023-03-15 MED ORDER — HYDROCHLOROTHIAZIDE 25 MG PO TABS: 25.0000 mg | ORAL_TABLET | Freq: Every day | ORAL | 1 refills | Status: DC

## 2023-03-15 MED ORDER — ATORVASTATIN CALCIUM 40 MG PO TABS: 40.0000 mg | ORAL_TABLET | Freq: Every day | ORAL | 1 refills | Status: DC

## 2023-03-15 NOTE — Patient Instructions (Signed)

## 2023-03-15 NOTE — Progress Notes (Signed)
Subjective:    Patient ID: Peter Murray., male    DOB: 30-Apr-1971, 52 y.o.   MRN: [id number]   Chief Complaint: No orders of the defined types were placed in this encounter.     HPI:  Peter Murray. is a 52 y.o. who identifies as a male who was assigned male at birth.   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 Primary hypertension No c/o chest pain sob or headache. Does not check blood pressure at home. BP Readings from Last 3 Encounters:  09/14/22 114/73  03/14/22 110/71  10/26/21 125/72     2. Pure hypercholesterolemia Does not watch diet and does no dedicated exercise. Lab Results  Component Value Date   CHOL 130 09/14/2022   HDL 40 09/14/2022   LDLCALC 75 09/14/2022   TRIG 76 09/14/2022   CHOLHDL 3.3 09/14/2022    3. Recurrent major depressive disorder, in full remission ([facility]) Is currently on no antidepressant  4. BMI 38.0-38.9,adult Weight down 4 lbs Wt Readings from Last 3 Encounters:  03/15/23 286 lb (129.7 kg)  09/14/22 290 lb (131.5 kg)  03/14/22 297 lb (134.7 kg)   BMI Readings from Last 3 Encounters:  03/15/23 35.75 kg/m  09/14/22 36.25 kg/m  03/14/22 37.12 kg/m     New complaints: None today  No Known Allergies Outpatient Encounter Medications as of 03/15/2023  Medication Sig   atorvastatin (LIPITOR) 40 MG tablet Take 1 tablet (40 mg total) by mouth daily.   fluorouracil (EFUDEX) 5 % cream fluorouracil 5 % topical cream   hydrochlorothiazide (HYDRODIURIL) 25 MG tablet Take 1 tablet (25 mg total) by mouth daily.   ibuprofen (ADVIL) 200 MG tablet Take 200 mg by mouth every 6 (six) hours as needed.   Multiple Vitamin (MULTI-VITAMIN) tablet Take 1 tablet by mouth daily.   No facility-administered encounter medications on file as of 03/15/2023.    Past Surgical History:  Procedure Laterality Date    LAPAROSCOPIC GASTRIC BANDING      Family History  Problem Relation Age of Onset   Heart disease Mother    Hyperlipidemia Mother    Hypertension Mother    Diabetes Mother    Heart disease Father    Hyperlipidemia Father    Hypertension Father    Stroke Father    Colon polyps Neg Hx    Colon cancer Neg Hx    Esophageal cancer Neg Hx    Stomach cancer Neg Hx    Rectal cancer Neg Hx       Controlled substance contract: n/a     Review of Systems  Constitutional:  Negative for diaphoresis.  Eyes:  Negative for pain.  Respiratory:  Negative for shortness of breath.   Cardiovascular:  Negative for chest pain, palpitations and leg swelling.  Gastrointestinal:  Negative for abdominal pain.  Endocrine: Negative for polydipsia.  Skin:  Negative for rash.  Neurological:  Negative for dizziness, weakness and headaches.  Hematological:  Does not bruise/bleed easily.  All other systems reviewed and are negative.      Objective:   Physical Exam Vitals and nursing note reviewed.  Constitutional:      Appearance: Normal appearance. He is well-developed.  HENT:     Head: Normocephalic.     Nose: Nose normal.     Mouth/Throat:     Mouth: Mucous membranes are moist.  Pharynx: Oropharynx is clear.  Eyes:     Pupils: Pupils are equal, round, and reactive to light.  Neck:     Thyroid: No thyroid mass or thyromegaly.     Vascular: No carotid bruit or JVD.     Trachea: Phonation normal.  Cardiovascular:     Rate and Rhythm: Normal rate and regular rhythm.  Pulmonary:     Effort: Pulmonary effort is normal. No respiratory distress.     Breath sounds: Normal breath sounds.  Abdominal:     General: Bowel sounds are normal.     Palpations: Abdomen is soft.     Tenderness: There is no abdominal tenderness.  Musculoskeletal:        General: Normal range of motion.     Cervical back: Normal range of motion and neck supple.  Lymphadenopathy:     Cervical: No cervical  adenopathy.  Skin:    General: Skin is warm and dry.  Neurological:     Mental Status: He is alert and oriented to person, place, and time.  Psychiatric:        Behavior: Behavior normal.        Thought Content: Thought content normal.        Judgment: Judgment normal.    BP 118/80   Pulse 60   Temp 97.8 F (36.6 C)   Ht 6\' 3"  (1.905 m)   Wt 286 lb (129.7 kg)   SpO2 96%   BMI 35.75 kg/m         Assessment & Plan:  Peter Murray. comes in today with chief complaint of Medical Management of Chronic Issues   Diagnosis and orders addressed:  1. Primary hypertension Low sodium diet - hydrochlorothiazide (HYDRODIURIL) 25 MG tablet; Take 1 tablet (25 mg total) by mouth daily.  Dispense: 90 tablet; Refill: 1 - CBC with Differential/Platelet - CMP14+EGFR  2. Pure hypercholesterolemia Low fat diet - atorvastatin (LIPITOR) 40 MG tablet; Take 1 tablet (40 mg total) by mouth daily.  Dispense: 90 tablet; Refill: 1 - Lipid panel  3. Recurrent major depressive disorder, in full remission ([facility]) Stress management  4. BMI 38.0-38.9,adult Discussed diet and exercise for person with BMI >25 Will recheck weight in 3-6 months    Labs pending Health Maintenance reviewed Diet and exercise encouraged  Follow up plan: 6 months   Mary-Margaret Daphine Deutscher, FNP

## 2023-09-11 IMAGING — DX DG ANKLE COMPLETE 3+V*L*
3 series · 3 of 3 positions shown · non-contrast
Comparison: None.

CLINICAL DATA: Stepped in hole, rolled ankle, felt pop, pain and
swelling over lateral malleolus

EXAM:
LEFT ANKLE COMPLETE - 3+ VIEW

[ankle ap]
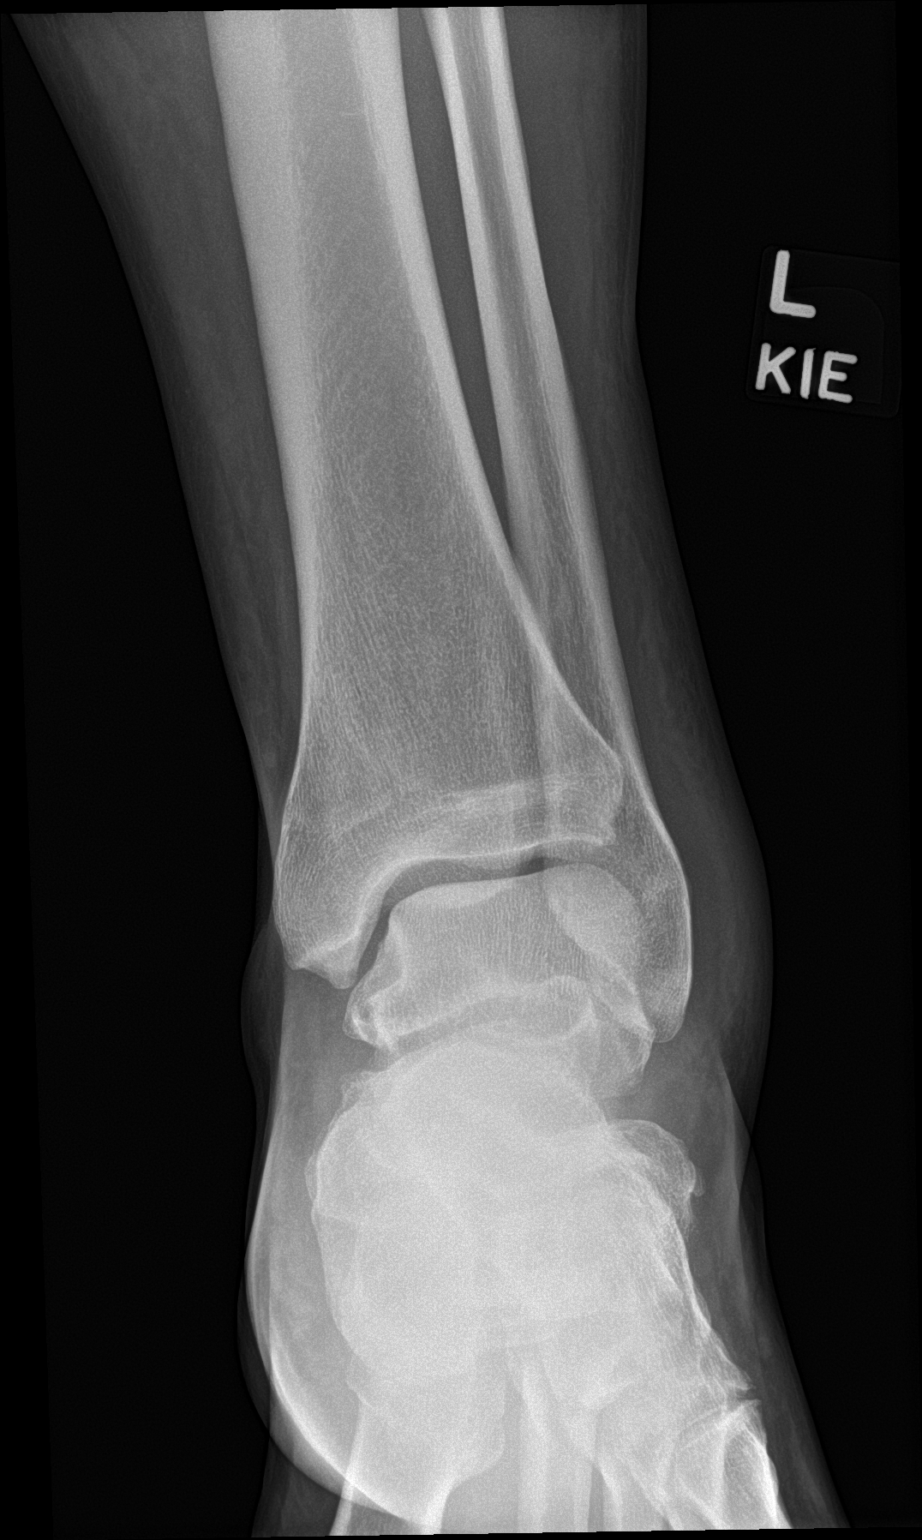

[ankle obl]
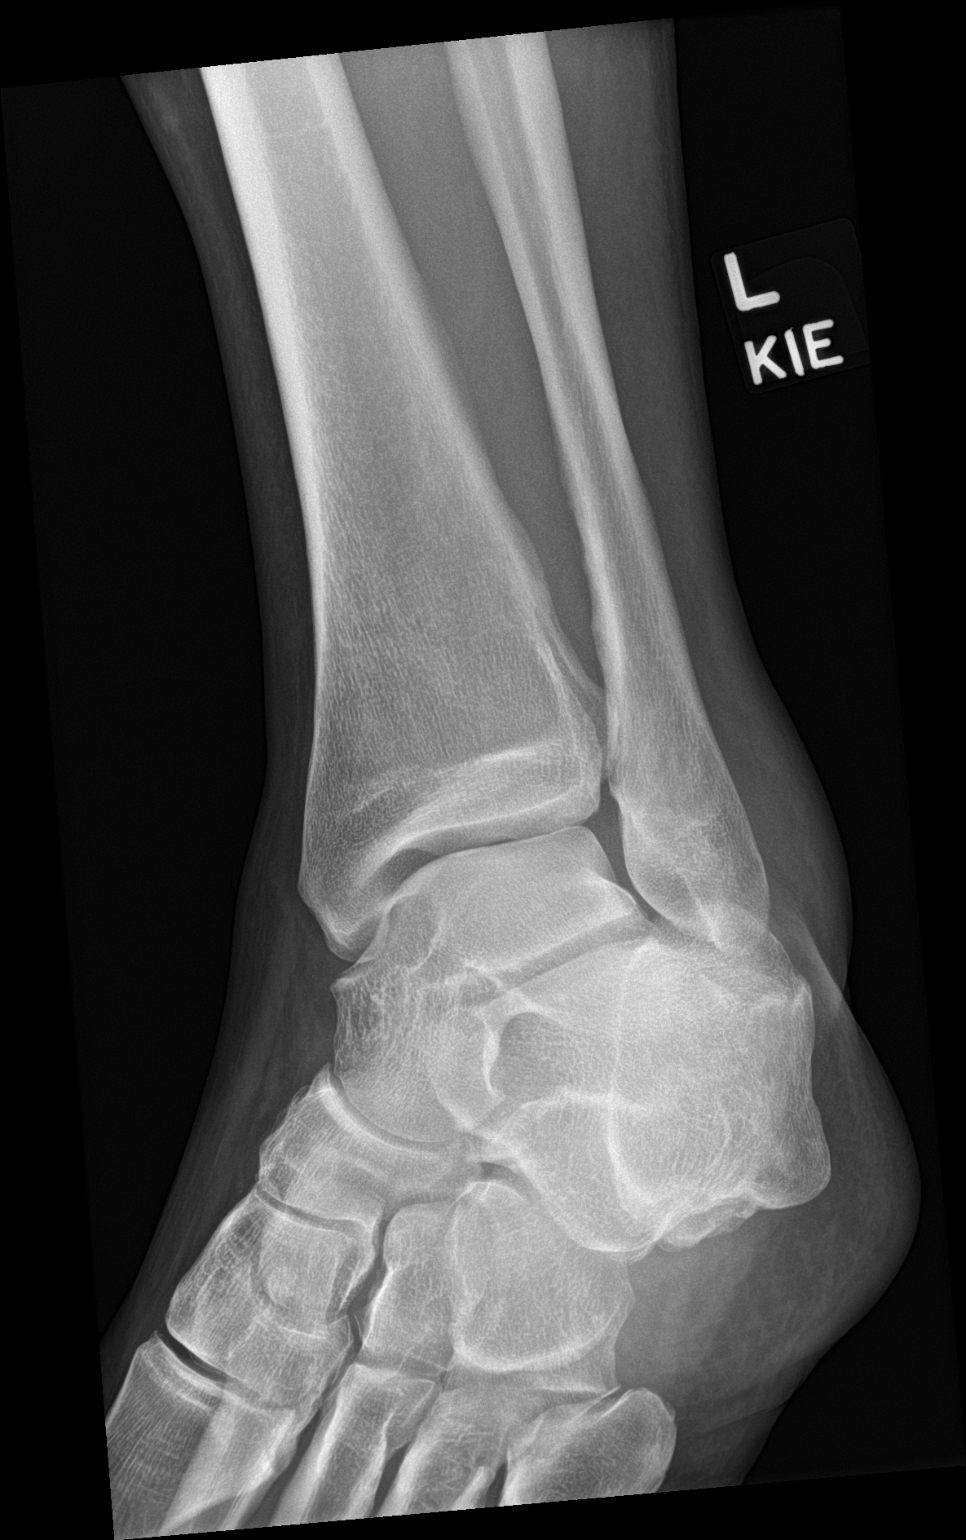

[ankle lat]
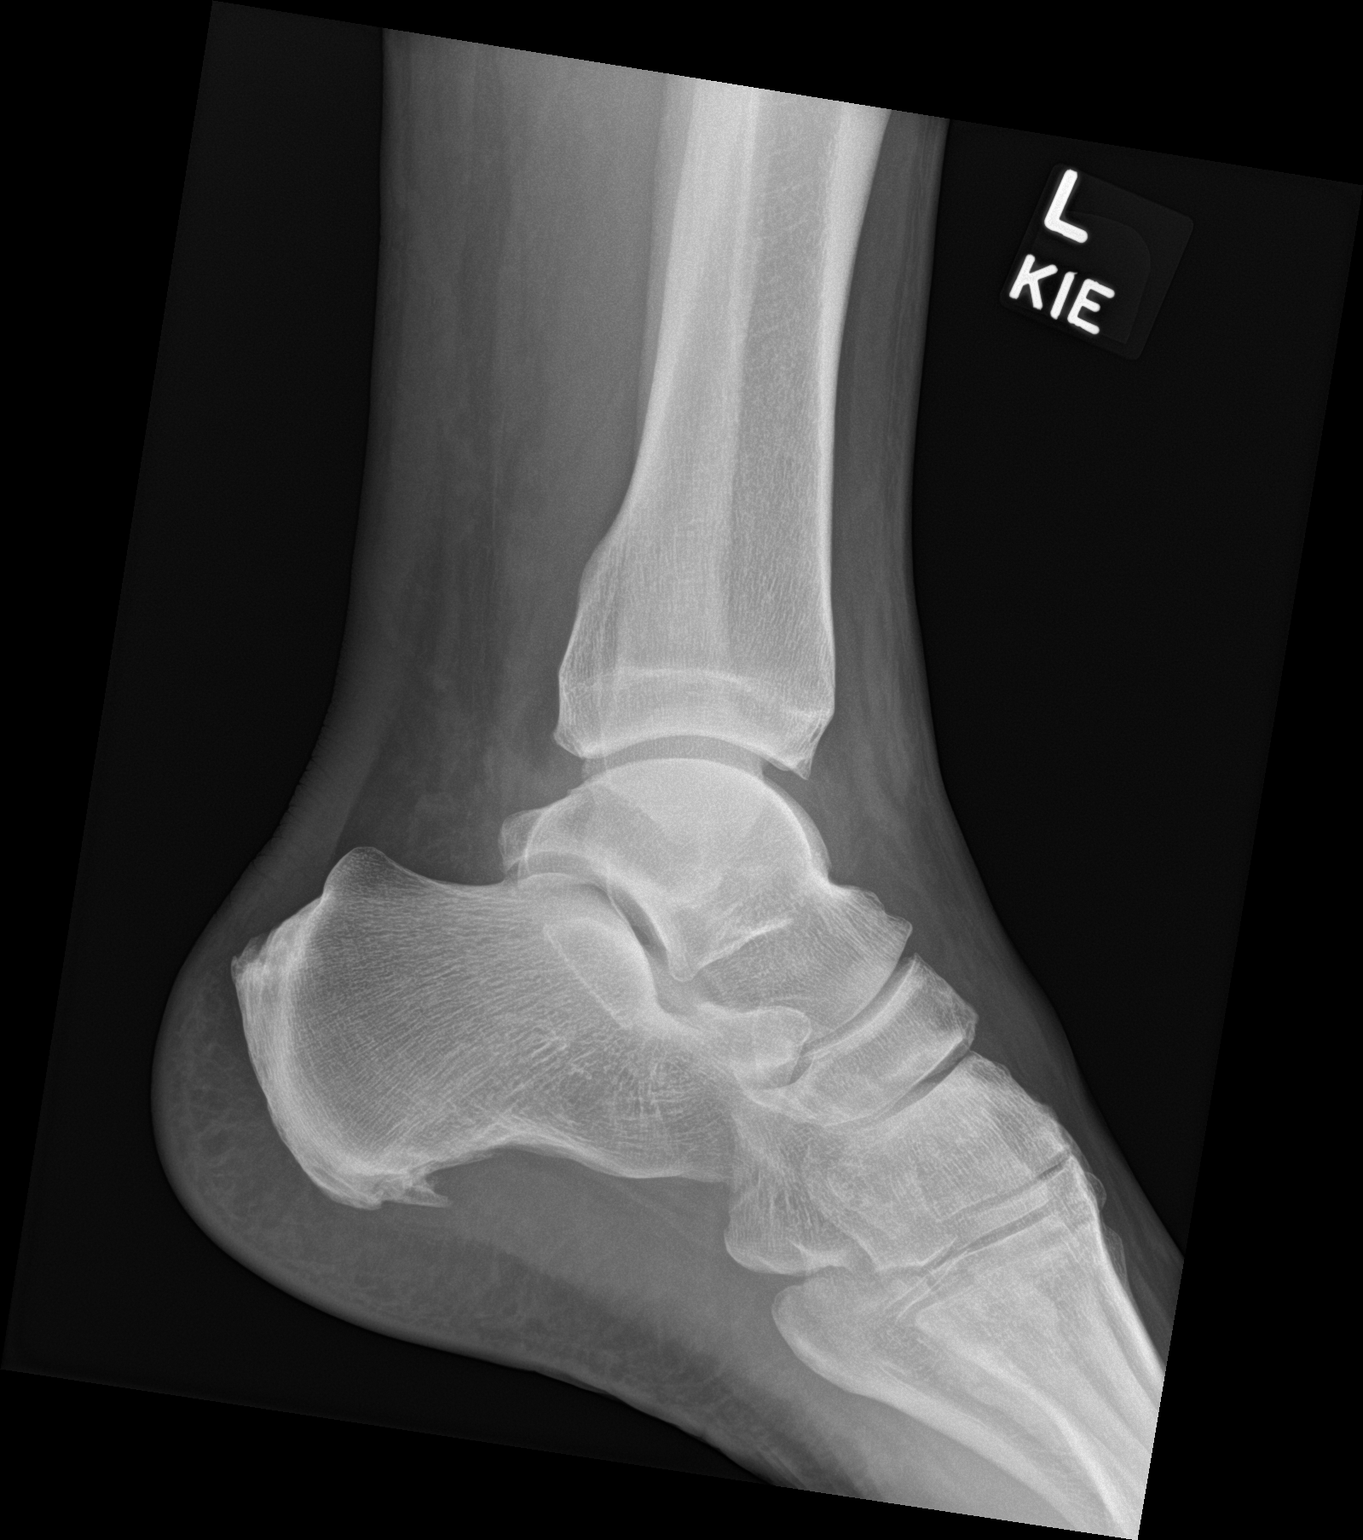

[3 of 3 positions shown; findings below may reference images not displayed]

FINDINGS: There is no evidence of fracture, dislocation, or joint effusion.
There is no evidence of arthropathy or other focal bone abnormality.
Soft tissue edema over the lateral malleolus.
IMPRESSION: No fracture or dislocation of the left ankle. Soft tissue edema over
the lateral malleolus.

## 2023-09-17 ENCOUNTER — Encounter: Payer: Self-pay | Admitting: Nurse Practitioner

## 2023-09-17 ENCOUNTER — Ambulatory Visit: Payer: BC Managed Care – PPO | Admitting: Nurse Practitioner

## 2023-09-17 VITALS — BP 125/82 | HR 69 | Temp 98.1°F | Ht 75.0 in | Wt 295.0 lb

## 2023-09-17 DIAGNOSIS — E7800 Pure hypercholesterolemia, unspecified: Secondary | ICD-10-CM

## 2023-09-17 DIAGNOSIS — Z23 Encounter for immunization: Secondary | ICD-10-CM | POA: Diagnosis not present

## 2023-09-17 DIAGNOSIS — E162 Hypoglycemia, unspecified: Secondary | ICD-10-CM

## 2023-09-17 DIAGNOSIS — Z6838 Body mass index (BMI) 38.0-38.9, adult: Secondary | ICD-10-CM

## 2023-09-17 DIAGNOSIS — F3342 Major depressive disorder, recurrent, in full remission: Secondary | ICD-10-CM

## 2023-09-17 DIAGNOSIS — I10 Essential (primary) hypertension: Secondary | ICD-10-CM | POA: Diagnosis not present

## 2023-09-17 LAB — LIPID PANEL

## 2023-09-17 MED ORDER — ATORVASTATIN CALCIUM 40 MG PO TABS: 40.0000 mg | ORAL_TABLET | Freq: Every day | ORAL | 1 refills | Status: DC

## 2023-09-17 MED ORDER — HYDROCHLOROTHIAZIDE 25 MG PO TABS: 25.0000 mg | ORAL_TABLET | Freq: Every day | ORAL | 1 refills | Status: DC

## 2023-09-17 NOTE — Progress Notes (Signed)
Subjective:    Patient ID: Peter Alderman., male    DOB: 1971-06-20, 53 y.o.   MRN: [id number]   Chief Complaint: medical management of chronic issues      HPI:  Peter Capers. is a 53 y.o. who identifies as a male who was assigned male at birth.   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 Primary hypertension No c/o chest pain sob or headache. Does not check blood pressure at home. BP Readings from Last 3 Encounters:  03/15/23 118/80  09/14/22 114/73  03/14/22 110/71     2. Pure hypercholesterolemia Does not watch diet and does no dedicated exercise. Lab Results  Component Value Date   CHOL 119 03/15/2023   HDL 42 03/15/2023   LDLCALC 64 03/15/2023   TRIG 58 03/15/2023   CHOLHDL 2.8 03/15/2023  The ASCVD Risk score (Arnett DK, et al., 2019) failed to calculate for the following reasons:   The valid total cholesterol range is 130 to 320 mg/dL   3. Recurrent major depressive disorder, in full remission (HCC) Is currently on no antidepressant  4. BMI 38.0-38.9,adult Weight down 4 lbs  Wt Readings from Last 3 Encounters:  09/17/23 295 lb (133.8 kg)  03/15/23 286 lb (129.7 kg)  09/14/22 290 lb (131.5 kg)   BMI Readings from Last 3 Encounters:  09/17/23 36.87 kg/m  03/15/23 35.75 kg/m  09/14/22 36.25 kg/m      New complaints: None today  No Known Allergies Outpatient Encounter Medications as of 09/17/2023  Medication Sig   atorvastatin (LIPITOR) 40 MG tablet Take 1 tablet (40 mg total) by mouth daily.   fluorouracil (EFUDEX) 5 % cream fluorouracil 5 % topical cream   hydrochlorothiazide (HYDRODIURIL) 25 MG tablet Take 1 tablet (25 mg total) by mouth daily.   ibuprofen (ADVIL) 200 MG tablet Take 200 mg by mouth every 6 (six) hours as needed.   Multiple Vitamin (MULTI-VITAMIN) tablet Take 1 tablet by mouth daily.   No  facility-administered encounter medications on file as of 09/17/2023.    Past Surgical History:  Procedure Laterality Date   LAPAROSCOPIC GASTRIC BANDING      Family History  Problem Relation Age of Onset   Heart disease Mother    Hyperlipidemia Mother    Hypertension Mother    Diabetes Mother    Heart disease Father    Hyperlipidemia Father    Hypertension Father    Stroke Father    Colon polyps Neg Hx    Colon cancer Neg Hx    Esophageal cancer Neg Hx    Stomach cancer Neg Hx    Rectal cancer Neg Hx       Controlled substance contract: n/a     Review of Systems  Constitutional:  Negative for diaphoresis.  Eyes:  Negative for pain.  Respiratory:  Negative for shortness of breath.   Cardiovascular:  Negative for chest pain, palpitations and leg swelling.  Gastrointestinal:  Negative for abdominal pain.  Endocrine: Negative for polydipsia.  Skin:  Negative for rash.  Neurological:  Negative for dizziness, weakness and headaches.  Hematological:  Does not bruise/bleed easily.  All other systems reviewed and are negative.      Objective:   Physical Exam Vitals and nursing note reviewed.  Constitutional:      Appearance: Normal appearance. He is well-developed.  HENT:  Head: Normocephalic.     Nose: Nose normal.     Mouth/Throat:     Mouth: Mucous membranes are moist.     Pharynx: Oropharynx is clear.  Eyes:     Pupils: Pupils are equal, round, and reactive to light.  Neck:     Thyroid: No thyroid mass or thyromegaly.     Vascular: No carotid bruit or JVD.     Trachea: Phonation normal.  Cardiovascular:     Rate and Rhythm: Normal rate and regular rhythm.  Pulmonary:     Effort: Pulmonary effort is normal. No respiratory distress.     Breath sounds: Normal breath sounds.  Abdominal:     General: Bowel sounds are normal.     Palpations: Abdomen is soft.     Tenderness: There is no abdominal tenderness.  Musculoskeletal:        General: Normal  range of motion.     Cervical back: Normal range of motion and neck supple.  Lymphadenopathy:     Cervical: No cervical adenopathy.  Skin:    General: Skin is warm and dry.  Neurological:     Mental Status: He is alert and oriented to person, place, and time.  Psychiatric:        Behavior: Behavior normal.        Thought Content: Thought content normal.        Judgment: Judgment normal.    BP 125/82   Pulse 69   Temp 98.1 F (36.7 C) (Temporal)   Ht 6\' 3"  (1.905 m)   Wt 295 lb (133.8 kg)   SpO2 96%   BMI 36.87 kg/m          Assessment & Plan:  Peter Alderman. comes in today with chief complaint of No chief complaint on file.   Diagnosis and orders addressed:  1. Primary hypertension Low sodium diet - hydrochlorothiazide (HYDRODIURIL) 25 MG tablet; Take 1 tablet (25 mg total) by mouth daily.  Dispense: 90 tablet; Refill: 1 - CBC with Differential/Platelet - CMP14+EGFR  2. Pure hypercholesterolemia Low fat diet - atorvastatin (LIPITOR) 40 MG tablet; Take 1 tablet (40 mg total) by mouth daily.  Dispense: 90 tablet; Refill: 1 - Lipid panel  3. Recurrent major depressive disorder, in full remission (HCC) Stress management  4. BMI 38.0-38.9,adult Discussed diet and exercise for person with BMI >25 Will recheck weight in 3-6 months    Labs pending Health Maintenance reviewed Diet and exercise encouraged  Follow up plan: 6 months   Mary-Margaret Daphine Deutscher, FNP

## 2023-09-17 NOTE — Patient Instructions (Signed)

## 2023-09-18 LAB — CMP14+EGFR
ALT: 22 IU/L (ref 0–44)
AST: 21 [IU]/L (ref 0–40)
Albumin: 4.5 g/dL (ref 3.8–4.9)
Alkaline Phosphatase: 69 [IU]/L (ref 44–121)
BUN/Creatinine Ratio: 9 (ref 9–20)
BUN: 11 mg/dL (ref 6–24)
Bilirubin Total: 0.9 mg/dL (ref 0.0–1.2)
CO2: 25 mmol/L (ref 20–29)
Calcium: 9.7 mg/dL (ref 8.7–10.2)
Chloride: 100 mmol/L (ref 96–106)
Creatinine, Ser: 1.19 mg/dL (ref 0.76–1.27)
Globulin, Total: 2.5 g/dL (ref 1.5–4.5)
Glucose: 134 mg/dL — ABNORMAL HIGH (ref 70–99)
Potassium: 4.2 mmol/L (ref 3.5–5.2)
Sodium: 142 mmol/L (ref 134–144)
Total Protein: 7 g/dL (ref 6.0–8.5)
eGFR: 73 mL/min/{1.73_m2} (ref >59)

## 2023-09-18 LAB — CBC WITH DIFFERENTIAL/PLATELET
Basophils Absolute: 0.1 10*3/uL (ref 0.0–0.2)
Basos: 1 %
EOS (ABSOLUTE): 0.2 10*3/uL (ref 0.0–0.4)
Eos: 3 %
Hematocrit: 53 % — ABNORMAL HIGH (ref 37.5–51.0)
Hemoglobin: 18.4 g/dL — ABNORMAL HIGH (ref 13.0–17.7)
Immature Grans (Abs): 0 10*3/uL (ref 0.0–0.1)
Immature Granulocytes: 0 %
Lymphocytes Absolute: 2.6 10*3/uL (ref 0.7–3.1)
Lymphs: 42 %
MCH: 31 pg (ref 26.6–33.0)
MCHC: 34.7 g/dL (ref 31.5–35.7)
MCV: 89 fL (ref 79–97)
Monocytes Absolute: 0.6 10*3/uL (ref 0.1–0.9)
Monocytes: 9 %
Neutrophils Absolute: 2.8 10*3/uL (ref 1.4–7.0)
Neutrophils: 45 %
Platelets: 245 10*3/uL (ref 150–450)
RBC: 5.94 x10E6/uL — ABNORMAL HIGH (ref 4.14–5.80)
RDW: 12.8 % (ref 11.6–15.4)
WBC: 6.3 10*3/uL (ref 3.4–10.8)

## 2023-09-18 LAB — LIPID PANEL
Cholesterol, Total: 150 mg/dL (ref 100–199)
HDL: 46 mg/dL (ref >39)
LDL CALC COMMENT:: 3.3 ratio (ref 0.0–5.0)
LDL Chol Calc (NIH): 90 mg/dL (ref 0–99)
Triglycerides: 69 mg/dL (ref 0–149)
VLDL Cholesterol Cal: 14 mg/dL (ref 5–40)

## 2023-09-18 NOTE — Addendum Note (Signed)
Addended by: Bennie Pierini on: 09/18/2023 01:01 PM   Modules accepted: Orders

## 2023-09-19 LAB — HGB A1C W/O EAG: Hgb A1c MFr Bld: 6.3 % — ABNORMAL HIGH (ref 4.8–5.6)

## 2023-09-19 LAB — SPECIMEN STATUS REPORT

## 2023-11-26 ENCOUNTER — Other Ambulatory Visit: Payer: Self-pay | Admitting: Nurse Practitioner

## 2023-11-26 DIAGNOSIS — E119 Type 2 diabetes mellitus without complications: Secondary | ICD-10-CM | POA: Insufficient documentation

## 2023-11-26 MED ORDER — SEMAGLUTIDE(0.25 OR 0.5MG/DOS) 2 MG/3ML ~~LOC~~ SOPN: PEN_INJECTOR | SUBCUTANEOUS | 2 refills | Status: DC

## 2023-11-28 ENCOUNTER — Other Ambulatory Visit (HOSPITAL_COMMUNITY): Payer: Self-pay

## 2023-11-28 ENCOUNTER — Telehealth: Payer: Self-pay | Admitting: Pharmacy Technician

## 2023-11-28 NOTE — Telephone Encounter (Signed)
 Pharmacy Patient Advocate Encounter   Received notification from CoverMyMeds that prior authorization for Ozempic (0.25 or 0.5 MG/DOSE) 2MG /3ML pen-injectors is required/requested.   Insurance verification completed.   The patient is insured through  [employer]  .   Patient must have a diagnosis of Type 2 diabetes with evidence of an A1C lab value of 6.5 or higher. Ran test claim for [facility] and weight loss medications are excluded from coverage. Please advise.  CMM Key: [id number] achieved.

## 2023-12-05 NOTE — Telephone Encounter (Signed)
 Pt notfied via MyChart.

## 2024-03-14 ENCOUNTER — Other Ambulatory Visit: Payer: Self-pay | Admitting: Medical Genetics

## 2024-03-20 NOTE — Progress Notes (Unsigned)
 Subjective:    Patient ID: Peter Murray., male    DOB: Jul 02, 1971, 53 y.o.   MRN: [id number]   Chief Complaint: annual physical      HPI:  Peter Murray. is a 53 y.o. who identifies as a male who was assigned male at birth.   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 Primary hypertension No c/o chest pain sob or headache. Does not check blood pressure at home. BP Readings from Last 3 Encounters:  09/17/23 125/82  03/15/23 118/80  09/14/22 114/73     2. Pure hypercholesterolemia Does not watch diet and does no dedicated exercise. Lab Results  Component Value Date   CHOL 150 09/17/2023   HDL 46 09/17/2023   LDLCALC 90 09/17/2023   TRIG 69 09/17/2023   CHOLHDL 3.3 09/17/2023  The 10-year ASCVD risk score (Arnett DK, et al., 2019) is: 13.6%   3. Recurrent major depressive disorder, in full remission (HCC) Is currently on no antidepressant  4. BMI 38.0-38.9,adult Weight down 4 lbs  Wt Readings from Last 3 Encounters:  09/17/23 295 lb (133.8 kg)  03/15/23 286 lb (129.7 kg)  09/14/22 290 lb (131.5 kg)   BMI Readings from Last 3 Encounters:  09/17/23 36.87 kg/m  03/15/23 35.75 kg/m  09/14/22 36.25 kg/m      New complaints: None today  No Known Allergies Outpatient Encounter Medications as of 03/21/2024  Medication Sig   atorvastatin  (LIPITOR) 40 MG tablet Take 1 tablet (40 mg total) by mouth daily.   fluorouracil (EFUDEX) 5 % cream fluorouracil 5 % topical cream   hydrochlorothiazide  (HYDRODIURIL ) 25 MG tablet Take 1 tablet (25 mg total) by mouth daily.   ibuprofen  (ADVIL ) 200 MG tablet Take 200 mg by mouth every 6 (six) hours as needed.   Multiple Vitamin (MULTI-VITAMIN) tablet Take 1 tablet by mouth daily.   Semaglutide ,0.25 or 0.5MG /DOS, 2 MG/3ML SOPN Inject 0.25ng subcutaneous weekly for 2 weeks then increase to 0.5mg   weekly   No facility-administered encounter medications on file as of 03/21/2024.    Past Surgical History:  Procedure Laterality Date   LAPAROSCOPIC GASTRIC BANDING      Family History  Problem Relation Age of Onset   Heart disease Mother    Hyperlipidemia Mother    Hypertension Mother    Diabetes Mother    Heart disease Father    Hyperlipidemia Father    Hypertension Father    Stroke Father    Colon polyps Neg Hx    Colon cancer Neg Hx    Esophageal cancer Neg Hx    Stomach cancer Neg Hx    Rectal cancer Neg Hx       Controlled substance contract: n/a     Review of Systems  Constitutional:  Negative for diaphoresis.  Eyes:  Negative for pain.  Respiratory:  Negative for shortness of breath.   Cardiovascular:  Negative for chest pain, palpitations and leg swelling.  Gastrointestinal:  Negative for abdominal pain.  Endocrine: Negative for polydipsia.  Skin:  Negative for rash.  Neurological:  Negative for dizziness, weakness and headaches.  Hematological:  Does not bruise/bleed easily.  All other systems reviewed and are negative.      Objective:   Physical Exam Vitals and nursing note reviewed.  Constitutional:      Appearance: Normal appearance. He is well-developed.  HENT:  Head: Normocephalic.     Nose: Nose normal.     Mouth/Throat:     Mouth: Mucous membranes are moist.     Pharynx: Oropharynx is clear.  Eyes:     Pupils: Pupils are equal, round, and reactive to light.  Neck:     Thyroid: No thyroid mass or thyromegaly.     Vascular: No carotid bruit or JVD.     Trachea: Phonation normal.  Cardiovascular:     Rate and Rhythm: Normal rate and regular rhythm.  Pulmonary:     Effort: Pulmonary effort is normal. No respiratory distress.     Breath sounds: Normal breath sounds.  Abdominal:     General: Bowel sounds are normal.     Palpations: Abdomen is soft.     Tenderness: There is no abdominal tenderness.  Musculoskeletal:         General: Normal range of motion.     Cervical back: Normal range of motion and neck supple.  Lymphadenopathy:     Cervical: No cervical adenopathy.  Skin:    General: Skin is warm and dry.  Neurological:     Mental Status: He is alert and oriented to person, place, and time.  Psychiatric:        Behavior: Behavior normal.        Thought Content: Thought content normal.        Judgment: Judgment normal.    There were no vitals taken for this visit.         Assessment & Plan:  Peter Murray. comes in today with chief complaint of No chief complaint on file.   Diagnosis and orders addressed:  1. Primary hypertension Low sodium diet - hydrochlorothiazide  (HYDRODIURIL ) 25 MG tablet; Take 1 tablet (25 mg total) by mouth daily.  Dispense: 90 tablet; Refill: 1 - CBC with Differential/Platelet - CMP14+EGFR  2. Pure hypercholesterolemia Low fat diet - atorvastatin  (LIPITOR) 40 MG tablet; Take 1 tablet (40 mg total) by mouth daily.  Dispense: 90 tablet; Refill: 1 - Lipid panel  3. Recurrent major depressive disorder, in full remission (HCC) Stress management  4. BMI 38.0-38.9,adult Discussed diet and exercise for person with BMI >25 Will recheck weight in 3-6 months    Labs pending Health Maintenance reviewed Diet and exercise encouraged  Follow up plan: 6 months   Mary-Margaret Gladis, FNP

## 2024-03-21 ENCOUNTER — Ambulatory Visit: Payer: BC Managed Care – PPO | Admitting: Nurse Practitioner

## 2024-03-21 ENCOUNTER — Encounter: Payer: Self-pay | Admitting: Nurse Practitioner

## 2024-03-21 VITALS — BP 116/76 | HR 58 | Temp 97.8°F | Ht 75.0 in | Wt 295.0 lb

## 2024-03-21 DIAGNOSIS — F3342 Major depressive disorder, recurrent, in full remission: Secondary | ICD-10-CM

## 2024-03-21 DIAGNOSIS — Z0001 Encounter for general adult medical examination with abnormal findings: Secondary | ICD-10-CM

## 2024-03-21 DIAGNOSIS — I10 Essential (primary) hypertension: Secondary | ICD-10-CM

## 2024-03-21 DIAGNOSIS — E7800 Pure hypercholesterolemia, unspecified: Secondary | ICD-10-CM

## 2024-03-21 DIAGNOSIS — E119 Type 2 diabetes mellitus without complications: Secondary | ICD-10-CM | POA: Diagnosis not present

## 2024-03-21 DIAGNOSIS — Z0000 Encounter for general adult medical examination without abnormal findings: Secondary | ICD-10-CM

## 2024-03-21 DIAGNOSIS — Z125 Encounter for screening for malignant neoplasm of prostate: Secondary | ICD-10-CM

## 2024-03-21 DIAGNOSIS — Z6838 Body mass index (BMI) 38.0-38.9, adult: Secondary | ICD-10-CM

## 2024-03-21 LAB — BAYER DCA HB A1C WAIVED: HB A1C (BAYER DCA - WAIVED): 5.7 % — ABNORMAL HIGH (ref 4.8–5.6)

## 2024-03-21 LAB — LIPID PANEL

## 2024-03-21 MED ORDER — HYDROCHLOROTHIAZIDE 25 MG PO TABS: 25.0000 mg | ORAL_TABLET | Freq: Every day | ORAL | 1 refills | Status: DC

## 2024-03-21 MED ORDER — ATORVASTATIN CALCIUM 40 MG PO TABS: 40.0000 mg | ORAL_TABLET | Freq: Every day | ORAL | 1 refills | Status: DC

## 2024-03-22 LAB — CBC WITH DIFFERENTIAL/PLATELET
Basophils Absolute: 0.1 x10E3/uL (ref 0.0–0.2)
Basos: 1 %
EOS (ABSOLUTE): 0.2 x10E3/uL (ref 0.0–0.4)
Eos: 2 %
Hematocrit: 51.4 % — ABNORMAL HIGH (ref 37.5–51.0)
Hemoglobin: 17.5 g/dL (ref 13.0–17.7)
Immature Grans (Abs): 0 x10E3/uL (ref 0.0–0.1)
Immature Granulocytes: 0 %
Lymphocytes Absolute: 2.7 x10E3/uL (ref 0.7–3.1)
Lymphs: 40 %
MCH: 31 pg (ref 26.6–33.0)
MCHC: 34 g/dL (ref 31.5–35.7)
MCV: 91 fL (ref 79–97)
Monocytes Absolute: 0.6 x10E3/uL (ref 0.1–0.9)
Monocytes: 9 %
Neutrophils Absolute: 3.2 x10E3/uL (ref 1.4–7.0)
Neutrophils: 48 %
Platelets: 267 x10E3/uL (ref 150–450)
RBC: 5.65 x10E6/uL (ref 4.14–5.80)
RDW: 13.1 % (ref 11.6–15.4)
WBC: 6.8 x10E3/uL (ref 3.4–10.8)

## 2024-03-22 LAB — CMP14+EGFR
ALT: 21 IU/L (ref 0–44)
AST: 17 IU/L (ref 0–40)
Albumin: 4.5 g/dL (ref 3.8–4.9)
Alkaline Phosphatase: 63 IU/L (ref 44–121)
BUN/Creatinine Ratio: 8 — ABNORMAL LOW (ref 9–20)
BUN: 9 mg/dL (ref 6–24)
Bilirubin Total: 0.7 mg/dL (ref 0.0–1.2)
CO2: 27 mmol/L (ref 20–29)
Calcium: 9.8 mg/dL (ref 8.7–10.2)
Chloride: 98 mmol/L (ref 96–106)
Creatinine, Ser: 1.13 mg/dL (ref 0.76–1.27)
Globulin, Total: 2.4 g/dL (ref 1.5–4.5)
Glucose: 122 mg/dL — ABNORMAL HIGH (ref 70–99)
Potassium: 4 mmol/L (ref 3.5–5.2)
Sodium: 140 mmol/L (ref 134–144)
Total Protein: 6.9 g/dL (ref 6.0–8.5)
eGFR: 78 mL/min/1.73 (ref >59)

## 2024-03-22 LAB — PSA, TOTAL AND FREE
PSA, Free Pct: 30 %
PSA, Free: 0.12 ng/mL
Prostate Specific Ag, Serum: 0.4 ng/mL (ref 0.0–4.0)

## 2024-03-22 LAB — LIPID PANEL
Chol/HDL Ratio: 3.8 ratio (ref 0.0–5.0)
Cholesterol, Total: 138 mg/dL (ref 100–199)
HDL: 36 mg/dL — ABNORMAL LOW (ref >39)
LDL Chol Calc (NIH): 86 mg/dL (ref 0–99)
Triglycerides: 81 mg/dL (ref 0–149)
VLDL Cholesterol Cal: 16 mg/dL (ref 5–40)

## 2024-03-22 LAB — MICROALBUMIN / CREATININE URINE RATIO
Creatinine, Urine: 199.2 mg/dL
Microalb/Creat Ratio: 3 mg/g{creat} (ref 0–29)
Microalbumin, Urine: 6.5 ug/mL

## 2024-03-24 ENCOUNTER — Ambulatory Visit: Payer: Self-pay | Admitting: Nurse Practitioner

## 2024-04-02 LAB — TESTOSTERONE FREE MS/DIALYSIS
Free Testosterone, Serum: 73 pg/mL
Testosterone, Serum (Total): 408 ng/dL
Testosterone-% Free: 1.8 %

## 2024-04-02 LAB — SPECIMEN STATUS REPORT

## 2024-06-19 ENCOUNTER — Other Ambulatory Visit: Payer: Self-pay | Admitting: Medical Genetics

## 2024-06-19 DIAGNOSIS — Z006 Encounter for examination for normal comparison and control in clinical research program: Secondary | ICD-10-CM

## 2024-09-19 ENCOUNTER — Encounter: Payer: Self-pay | Admitting: Nurse Practitioner

## 2024-09-19 ENCOUNTER — Ambulatory Visit (INDEPENDENT_AMBULATORY_CARE_PROVIDER_SITE_OTHER): Payer: Self-pay | Admitting: Nurse Practitioner

## 2024-09-19 VITALS — BP 122/82 | HR 71 | Temp 97.9°F | Ht 75.0 in | Wt 281.0 lb

## 2024-09-19 DIAGNOSIS — Z6838 Body mass index (BMI) 38.0-38.9, adult: Secondary | ICD-10-CM | POA: Diagnosis not present

## 2024-09-19 DIAGNOSIS — I10 Essential (primary) hypertension: Secondary | ICD-10-CM

## 2024-09-19 DIAGNOSIS — F3342 Major depressive disorder, recurrent, in full remission: Secondary | ICD-10-CM

## 2024-09-19 DIAGNOSIS — E7800 Pure hypercholesterolemia, unspecified: Secondary | ICD-10-CM | POA: Diagnosis not present

## 2024-09-19 DIAGNOSIS — E119 Type 2 diabetes mellitus without complications: Secondary | ICD-10-CM | POA: Diagnosis not present

## 2024-09-19 LAB — CBC WITH DIFFERENTIAL/PLATELET
Basophils Absolute: 0.1 10*3/uL (ref 0.0–0.2)
Basos: 1 %
EOS (ABSOLUTE): 0.2 10*3/uL (ref 0.0–0.4)
Eos: 3 %
Hematocrit: 54.6 % — ABNORMAL HIGH (ref 37.5–51.0)
Hemoglobin: 18.7 g/dL — ABNORMAL HIGH (ref 13.0–17.7)
Immature Grans (Abs): 0 10*3/uL (ref 0.0–0.1)
Immature Granulocytes: 0 %
Lymphocytes Absolute: 2.8 10*3/uL (ref 0.7–3.1)
Lymphs: 41 %
MCH: 30.1 pg (ref 26.6–33.0)
MCHC: 34.2 g/dL (ref 31.5–35.7)
MCV: 88 fL (ref 79–97)
Monocytes Absolute: 0.6 10*3/uL (ref 0.1–0.9)
Monocytes: 8 %
Neutrophils Absolute: 3.2 10*3/uL (ref 1.4–7.0)
Neutrophils: 47 %
Platelets: 259 10*3/uL (ref 150–450)
RBC: 6.22 x10E6/uL — ABNORMAL HIGH (ref 4.14–5.80)
RDW: 13.1 % (ref 11.6–15.4)
WBC: 6.9 10*3/uL (ref 3.4–10.8)

## 2024-09-19 LAB — CMP14+EGFR
ALT: 24 [IU]/L (ref 0–44)
AST: 21 [IU]/L (ref 0–40)
Albumin: 4.6 g/dL (ref 3.8–4.9)
Alkaline Phosphatase: 70 [IU]/L (ref 47–123)
BUN/Creatinine Ratio: 10 (ref 9–20)
BUN: 11 mg/dL (ref 6–24)
Bilirubin Total: 0.7 mg/dL (ref 0.0–1.2)
CO2: 26 mmol/L (ref 20–29)
Calcium: 9.9 mg/dL (ref 8.7–10.2)
Chloride: 99 mmol/L (ref 96–106)
Creatinine, Ser: 1.13 mg/dL (ref 0.76–1.27)
Globulin, Total: 2.5 g/dL (ref 1.5–4.5)
Glucose: 94 mg/dL (ref 70–99)
Potassium: 3.7 mmol/L (ref 3.5–5.2)
Sodium: 142 mmol/L (ref 134–144)
Total Protein: 7.1 g/dL (ref 6.0–8.5)
eGFR: 78 mL/min/{1.73_m2}

## 2024-09-19 LAB — LIPID PANEL
Chol/HDL Ratio: 3 ratio (ref 0.0–5.0)
Cholesterol, Total: 135 mg/dL (ref 100–199)
HDL: 45 mg/dL
LDL Chol Calc (NIH): 75 mg/dL (ref 0–99)
Triglycerides: 75 mg/dL (ref 0–149)
VLDL Cholesterol Cal: 15 mg/dL (ref 5–40)

## 2024-09-19 LAB — BAYER DCA HB A1C WAIVED: HB A1C (BAYER DCA - WAIVED): 5.2 % (ref 4.8–5.6)

## 2024-09-19 MED ORDER — ATORVASTATIN CALCIUM 40 MG PO TABS: 40.0000 mg | ORAL_TABLET | Freq: Every day | ORAL | 1 refills | Status: AC

## 2024-09-19 MED ORDER — HYDROCHLOROTHIAZIDE 25 MG PO TABS: 25.0000 mg | ORAL_TABLET | Freq: Every day | ORAL | 1 refills | Status: AC

## 2024-09-19 NOTE — Patient Instructions (Signed)
 Exercising to Stay Healthy To become healthy and stay healthy, it is recommended that you do moderate-intensity and vigorous-intensity exercise. You can tell that you are exercising at a moderate intensity if your heart starts beating faster and you start breathing faster but can still hold a conversation. You can tell that you are exercising at a vigorous intensity if you are breathing much harder and faster and cannot hold a conversation while exercising. How can exercise benefit me? Exercising regularly is important. It has many health benefits, such as: Improving overall fitness, flexibility, and endurance. Increasing bone density. Helping with weight control. Decreasing body fat. Increasing muscle strength and endurance. Reducing stress and tension, anxiety, depression, or anger. Improving overall health. What guidelines should I follow while exercising? Before you start a new exercise program, talk with your health care provider. Do not exercise so much that you hurt yourself, feel dizzy, or get very short of breath. Wear comfortable clothes and wear shoes with good support. Drink plenty of water while you exercise to prevent dehydration or heat stroke. Work out until your breathing and your heartbeat get faster (moderate intensity). How often should I exercise? Choose an activity that you enjoy, and set realistic goals. Your health care provider can help you make an activity plan that is individually designed and works best for you. Exercise regularly as told by your health care provider. This may include: Doing strength training two times a week, such as: Lifting weights. Using resistance bands. Push-ups. Sit-ups. Yoga. Doing a certain intensity of exercise for a given amount of time. Choose from these options: A total of 150 minutes of moderate-intensity exercise every week. A total of 75 minutes of vigorous-intensity exercise every week. A mix of moderate-intensity and  vigorous-intensity exercise every week. Children, pregnant women, people who have not exercised regularly, people who are overweight, and older adults may need to talk with a health care provider about what activities are safe to perform. If you have a medical condition, be sure to talk with your health care provider before you start a new exercise program. What are some exercise ideas? Moderate-intensity exercise ideas include: Walking 1 mile (1.6 km) in about 15 minutes. Biking. Hiking. Golfing. Dancing. Water aerobics. Vigorous-intensity exercise ideas include: Walking 4.5 miles (7.2 km) or more in about 1 hour. Jogging or running 5 miles (8 km) in about 1 hour. Biking 10 miles (16.1 km) or more in about 1 hour. Lap swimming. Roller-skating or in-line skating. Cross-country skiing. Vigorous competitive sports, such as football, basketball, and soccer. Jumping rope. Aerobic dancing. What are some everyday activities that can help me get exercise? Yard work, such as: Child psychotherapist. Raking and bagging leaves. Washing your car. Pushing a stroller. Shoveling snow. Gardening. Washing windows or floors. How can I be more active in my day-to-day activities? Use stairs instead of an elevator. Take a walk during your lunch break. If you drive, park your car farther away from your work or school. If you take public transportation, get off one stop early and walk the rest of the way. Stand up or walk around during all of your indoor phone calls. Get up, stretch, and walk around every 30 minutes throughout the day. Enjoy exercise with a friend. Support to continue exercising will help you keep a regular routine of activity. Where to find more information You can find more information about exercising to stay healthy from: U.[place]. Department of Health and Human Services: ThisPath.fi Centers for Disease Control and Prevention (  CDC): FootballExhibition.com.br Summary Exercising regularly is  important. It will improve your overall fitness, flexibility, and endurance. Regular exercise will also improve your overall health. It can help you control your weight, reduce stress, and improve your bone density. Do not exercise so much that you hurt yourself, feel dizzy, or get very short of breath. Before you start a new exercise program, talk with your health care provider. This information is not intended to replace advice given to you by your health care provider. Make sure you discuss any questions you have with your health care provider. Document Revised: 12/03/2020 Document Reviewed: 12/03/2020 Elsevier Patient Education  2024 [employer].

## 2024-09-19 NOTE — Progress Notes (Signed)
 "  Subjective:    Patient ID: Peter Liane Kay Mickey., male    DOB: Feb 26, 1971, 54 y.o.   MRN: [id number]   Chief Complaint: medical management of chronic issues      HPI:  Peter Gowan. is a 54 y.o. who identifies as a male who was assigned male at birth.   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 Primary hypertension No c/o chest pain sob or headache. Does not check blood pressure at home. BP Readings from Last 3 Encounters:  03/21/24 116/76  09/17/23 125/82  03/15/23 118/80     2. Pure hypercholesterolemia Does not watch diet and does no dedicated exercise. Lab Results  Component Value Date   CHOL 138 03/21/2024   HDL 36 (L) 03/21/2024   LDLCALC 86 03/21/2024   TRIG 81 03/21/2024   CHOLHDL 3.8 03/21/2024  The 10-year ASCVD risk score (Arnett DK, et al., 2019) is: 14.6%   3. Recurrent major depressive disorder, in full remission (HCC) Is currently on no antidepressant    09/19/2024    8:22 AM 03/21/2024    8:12 AM 09/17/2023    8:30 AM  Depression screen PHQ 2/9  Decreased Interest 1 1 0  Down, Depressed, Hopeless 1 0 0  PHQ - 2 Score 2 1 0  Altered sleeping 1 0 0  Tired, decreased energy 1 2 1   Change in appetite 1 2 1   Feeling bad or failure about yourself  1 0 0  Trouble concentrating 1 0 0  Moving slowly or fidgety/restless 0 0 0  Suicidal thoughts 0 0 0  PHQ-9 Score 7 5  2    Difficult doing work/chores Somewhat difficult Not difficult at all Not difficult at all     Data saved with a previous flowsheet row definition     4. BMI 38.0-38.9,adult Weight down 14lbs  Wt Readings from Last 3 Encounters:  09/19/24 281 lb (127.5 kg)  03/21/24 295 lb (133.8 kg)  09/17/23 295 lb (133.8 kg)   BMI Readings from Last 3 Encounters:  09/19/24 35.12 kg/m  03/21/24 36.87 kg/m  09/17/23 36.87 kg/m        New complaints: None  today  No Known Allergies Outpatient Encounter Medications as of 09/19/2024  Medication Sig   atorvastatin  (LIPITOR) 40 MG tablet Take 1 tablet (40 mg total) by mouth daily.   fluorouracil (EFUDEX) 5 % cream fluorouracil 5 % topical cream   hydrochlorothiazide  (HYDRODIURIL ) 25 MG tablet Take 1 tablet (25 mg total) by mouth daily.   ibuprofen  (ADVIL ) 200 MG tablet Take 200 mg by mouth every 6 (six) hours as needed.   Multiple Vitamin (MULTI-VITAMIN) tablet Take 1 tablet by mouth daily.   No facility-administered encounter medications on file as of 09/19/2024.    Past Surgical History:  Procedure Laterality Date   LAPAROSCOPIC GASTRIC BANDING      Family History  Problem Relation Age of Onset   Heart disease Mother    Hyperlipidemia Mother    Hypertension Mother    Diabetes Mother    Heart disease Father    Hyperlipidemia Father    Hypertension Father    Stroke Father    Colon polyps Neg Hx    Colon cancer Neg Hx    Esophageal cancer Neg Hx    Stomach cancer Neg Hx    Rectal cancer Neg Hx  Controlled substance contract: n/a     Review of Systems  Constitutional:  Negative for diaphoresis.  Eyes:  Negative for pain.  Respiratory:  Negative for shortness of breath.   Cardiovascular:  Negative for chest pain, palpitations and leg swelling.  Gastrointestinal:  Negative for abdominal pain.  Endocrine: Negative for polydipsia.  Skin:  Negative for rash.  Neurological:  Negative for dizziness, weakness and headaches.  Hematological:  Does not bruise/bleed easily.  All other systems reviewed and are negative.      Objective:   Physical Exam Vitals and nursing note reviewed.  Constitutional:      Appearance: Normal appearance. He is well-developed.  HENT:     Head: Normocephalic.     Nose: Nose normal.     Mouth/Throat:     Mouth: Mucous membranes are moist.     Pharynx: Oropharynx is clear.  Eyes:     Pupils: Pupils are equal, round, and reactive to  light.  Neck:     Thyroid: No thyroid mass or thyromegaly.     Vascular: No carotid bruit or JVD.     Trachea: Phonation normal.  Cardiovascular:     Rate and Rhythm: Normal rate and regular rhythm.  Pulmonary:     Effort: Pulmonary effort is normal. No respiratory distress.     Breath sounds: Normal breath sounds.  Abdominal:     General: Bowel sounds are normal.     Palpations: Abdomen is soft.     Tenderness: There is no abdominal tenderness.  Musculoskeletal:        General: Normal range of motion.     Cervical back: Normal range of motion and neck supple.  Lymphadenopathy:     Cervical: No cervical adenopathy.  Skin:    General: Skin is warm and dry.  Neurological:     Mental Status: He is alert and oriented to person, place, and time.  Psychiatric:        Behavior: Behavior normal.        Thought Content: Thought content normal.        Judgment: Judgment normal.    BP 122/82   Pulse 71   Temp 97.9 F (36.6 C) (Temporal)   Ht 6' 3 (1.905 m)   Wt 281 lb (127.5 kg)   SpO2 98%   BMI 35.12 kg/m   HGBA1c 5.2%    Assessment & Plan:  Peter Liane Kay Mickey. comes in today with chief complaint of Medical Management of Chronic Issues   Diagnosis and orders addressed:  1. Primary hypertension (Primary) Dash diet - CMP14+EGFR - CBC with Differential/Platelet - hydrochlorothiazide  (HYDRODIURIL ) 25 MG tablet; Take 1 tablet (25 mg total) by mouth daily.  Dispense: 90 tablet; Refill: 1  2. Pure hypercholesterolemia Low fat diet - Lipid panel - atorvastatin  (LIPITOR) 40 MG tablet; Take 1 tablet (40 mg total) by mouth daily.  Dispense: 90 tablet; Refill: 1  3. Diet-controlled diabetes mellitus (HCC) Continue to watch carbs in diet - Bayer DCA Hb A1c Waived  4. Recurrent major depressive disorder, in full remission Stress management  5. BMI 38.0-38.9,adult Discussed diet and exercise for person with BMI >25 Will recheck weight in 3-6 months    Labs  pending Health Maintenance reviewed Diet and exercise encouraged  Follow up plan: 6 month   Mary-Margaret Gladis, FNP  "

## 2024-09-22 ENCOUNTER — Ambulatory Visit: Payer: Self-pay | Admitting: Nurse Practitioner

## 2025-03-20 ENCOUNTER — Ambulatory Visit: Payer: Self-pay | Admitting: Nurse Practitioner
